# Patient Record
Sex: Female | Born: 1960 | Race: White | Hispanic: No | Marital: Married | Smoking: Former smoker
Health system: Southern US, Community
[De-identification: ages and names within clinical notes are randomized; demographics above are authoritative.]

## PROBLEM LIST (undated history)

## (undated) DIAGNOSIS — E785 Hyperlipidemia, unspecified: Secondary | ICD-10-CM

## (undated) DIAGNOSIS — K5790 Diverticulosis of intestine, part unspecified, without perforation or abscess without bleeding: Secondary | ICD-10-CM

## (undated) DIAGNOSIS — C4491 Basal cell carcinoma of skin, unspecified: Secondary | ICD-10-CM

## (undated) DIAGNOSIS — K635 Polyp of colon: Secondary | ICD-10-CM

## (undated) HISTORY — PX: WISDOM TOOTH EXTRACTION: SHX21

## (undated) HISTORY — PX: SQUAMOUS CELL CARCINOMA EXCISION: SHX2433

## (undated) HISTORY — DX: Diverticulosis of intestine, part unspecified, without perforation or abscess without bleeding: K57.90

## (undated) HISTORY — DX: Basal cell carcinoma of skin, unspecified: C44.91

## (undated) HISTORY — DX: Polyp of colon: K63.5

## (undated) HISTORY — DX: Hyperlipidemia, unspecified: E78.5

## (undated) HISTORY — PX: OTHER SURGICAL HISTORY: SHX169

---

## 2000-08-06 ENCOUNTER — Encounter: Admission: RE | Admit: 2000-08-06 | Discharge: 2000-08-06 | Payer: Self-pay | Admitting: Obstetrics and Gynecology

## 2000-08-06 ENCOUNTER — Encounter: Payer: Self-pay | Admitting: Obstetrics and Gynecology

## 2000-09-24 ENCOUNTER — Other Ambulatory Visit: Admission: RE | Admit: 2000-09-24 | Discharge: 2000-09-24 | Payer: Self-pay | Admitting: Obstetrics and Gynecology

## 2000-10-15 ENCOUNTER — Encounter (INDEPENDENT_AMBULATORY_CARE_PROVIDER_SITE_OTHER): Payer: Self-pay | Admitting: Specialist

## 2000-10-15 ENCOUNTER — Other Ambulatory Visit: Admission: RE | Admit: 2000-10-15 | Discharge: 2000-10-15 | Payer: Self-pay | Admitting: Obstetrics and Gynecology

## 2000-10-26 ENCOUNTER — Ambulatory Visit (HOSPITAL_COMMUNITY): Admission: RE | Admit: 2000-10-26 | Discharge: 2000-10-26 | Payer: Self-pay | Admitting: Obstetrics and Gynecology

## 2000-10-26 ENCOUNTER — Encounter (INDEPENDENT_AMBULATORY_CARE_PROVIDER_SITE_OTHER): Payer: Self-pay | Admitting: Specialist

## 2000-12-13 ENCOUNTER — Other Ambulatory Visit: Admission: RE | Admit: 2000-12-13 | Discharge: 2000-12-13 | Payer: Self-pay | Admitting: Obstetrics and Gynecology

## 2001-03-13 ENCOUNTER — Other Ambulatory Visit: Admission: RE | Admit: 2001-03-13 | Discharge: 2001-03-13 | Payer: Self-pay | Admitting: Obstetrics and Gynecology

## 2001-06-18 ENCOUNTER — Other Ambulatory Visit: Admission: RE | Admit: 2001-06-18 | Discharge: 2001-06-18 | Payer: Self-pay | Admitting: Obstetrics and Gynecology

## 2001-08-13 ENCOUNTER — Encounter: Admission: RE | Admit: 2001-08-13 | Discharge: 2001-08-13 | Payer: Self-pay | Admitting: Obstetrics and Gynecology

## 2001-08-13 ENCOUNTER — Encounter: Payer: Self-pay | Admitting: Obstetrics and Gynecology

## 2001-10-18 ENCOUNTER — Other Ambulatory Visit: Admission: RE | Admit: 2001-10-18 | Discharge: 2001-10-18 | Payer: Self-pay | Admitting: Obstetrics and Gynecology

## 2002-03-25 ENCOUNTER — Other Ambulatory Visit: Admission: RE | Admit: 2002-03-25 | Discharge: 2002-03-25 | Payer: Self-pay | Admitting: Obstetrics and Gynecology

## 2002-05-19 ENCOUNTER — Encounter: Payer: Self-pay | Admitting: Internal Medicine

## 2002-05-19 ENCOUNTER — Encounter: Admission: RE | Admit: 2002-05-19 | Discharge: 2002-05-19 | Payer: Self-pay | Admitting: Internal Medicine

## 2002-08-14 ENCOUNTER — Encounter: Admission: RE | Admit: 2002-08-14 | Discharge: 2002-08-14 | Payer: Self-pay | Admitting: Obstetrics and Gynecology

## 2002-08-14 ENCOUNTER — Encounter: Payer: Self-pay | Admitting: Obstetrics and Gynecology

## 2003-08-17 ENCOUNTER — Encounter: Admission: RE | Admit: 2003-08-17 | Discharge: 2003-08-17 | Payer: Self-pay | Admitting: Obstetrics and Gynecology

## 2003-08-17 ENCOUNTER — Encounter: Payer: Self-pay | Admitting: Obstetrics and Gynecology

## 2004-09-07 ENCOUNTER — Encounter: Admission: RE | Admit: 2004-09-07 | Discharge: 2004-09-07 | Payer: Self-pay | Admitting: Obstetrics and Gynecology

## 2005-05-16 ENCOUNTER — Ambulatory Visit: Payer: Self-pay | Admitting: Internal Medicine

## 2005-11-21 ENCOUNTER — Encounter: Admission: RE | Admit: 2005-11-21 | Discharge: 2005-11-21 | Payer: Self-pay | Admitting: Obstetrics and Gynecology

## 2005-11-22 ENCOUNTER — Encounter: Admission: RE | Admit: 2005-11-22 | Discharge: 2005-11-22 | Payer: Self-pay | Admitting: Obstetrics and Gynecology

## 2005-12-21 ENCOUNTER — Emergency Department (HOSPITAL_COMMUNITY): Admission: EM | Admit: 2005-12-21 | Discharge: 2005-12-21 | Payer: Self-pay | Admitting: Emergency Medicine

## 2006-10-08 ENCOUNTER — Ambulatory Visit: Payer: Self-pay | Admitting: Internal Medicine

## 2006-10-12 ENCOUNTER — Encounter: Admission: RE | Admit: 2006-10-12 | Discharge: 2006-10-12 | Payer: Self-pay | Admitting: Internal Medicine

## 2006-11-27 ENCOUNTER — Encounter: Admission: RE | Admit: 2006-11-27 | Discharge: 2006-11-27 | Payer: Self-pay | Admitting: Obstetrics and Gynecology

## 2006-12-05 ENCOUNTER — Encounter: Admission: RE | Admit: 2006-12-05 | Discharge: 2006-12-05 | Payer: Self-pay | Admitting: Obstetrics and Gynecology

## 2007-04-03 ENCOUNTER — Ambulatory Visit: Payer: Self-pay | Admitting: Internal Medicine

## 2007-09-09 ENCOUNTER — Telehealth (INDEPENDENT_AMBULATORY_CARE_PROVIDER_SITE_OTHER): Payer: Self-pay | Admitting: *Deleted

## 2007-12-17 ENCOUNTER — Encounter: Admission: RE | Admit: 2007-12-17 | Discharge: 2007-12-17 | Payer: Self-pay | Admitting: Obstetrics and Gynecology

## 2008-01-03 DIAGNOSIS — E785 Hyperlipidemia, unspecified: Secondary | ICD-10-CM

## 2008-06-16 ENCOUNTER — Ambulatory Visit: Payer: Self-pay | Admitting: Internal Medicine

## 2008-06-22 ENCOUNTER — Encounter (INDEPENDENT_AMBULATORY_CARE_PROVIDER_SITE_OTHER): Payer: Self-pay | Admitting: *Deleted

## 2008-08-25 ENCOUNTER — Telehealth (INDEPENDENT_AMBULATORY_CARE_PROVIDER_SITE_OTHER): Payer: Self-pay | Admitting: *Deleted

## 2008-12-28 ENCOUNTER — Ambulatory Visit: Payer: Self-pay | Admitting: Internal Medicine

## 2008-12-30 ENCOUNTER — Encounter: Admission: RE | Admit: 2008-12-30 | Discharge: 2008-12-30 | Payer: Self-pay | Admitting: Obstetrics and Gynecology

## 2008-12-31 ENCOUNTER — Encounter: Admission: RE | Admit: 2008-12-31 | Discharge: 2008-12-31 | Payer: Self-pay | Admitting: Obstetrics and Gynecology

## 2009-09-15 ENCOUNTER — Ambulatory Visit: Payer: Self-pay | Admitting: Internal Medicine

## 2009-09-15 DIAGNOSIS — N959 Unspecified menopausal and perimenopausal disorder: Secondary | ICD-10-CM | POA: Insufficient documentation

## 2009-09-15 DIAGNOSIS — C4491 Basal cell carcinoma of skin, unspecified: Secondary | ICD-10-CM | POA: Insufficient documentation

## 2009-09-16 ENCOUNTER — Encounter (INDEPENDENT_AMBULATORY_CARE_PROVIDER_SITE_OTHER): Payer: Self-pay | Admitting: *Deleted

## 2009-09-20 ENCOUNTER — Encounter (INDEPENDENT_AMBULATORY_CARE_PROVIDER_SITE_OTHER): Payer: Self-pay | Admitting: *Deleted

## 2009-09-20 ENCOUNTER — Telehealth (INDEPENDENT_AMBULATORY_CARE_PROVIDER_SITE_OTHER): Payer: Self-pay | Admitting: *Deleted

## 2009-09-20 ENCOUNTER — Encounter: Payer: Self-pay | Admitting: Internal Medicine

## 2009-09-20 ENCOUNTER — Ambulatory Visit: Payer: Self-pay | Admitting: Internal Medicine

## 2009-10-04 ENCOUNTER — Encounter (INDEPENDENT_AMBULATORY_CARE_PROVIDER_SITE_OTHER): Payer: Self-pay | Admitting: *Deleted

## 2009-10-27 ENCOUNTER — Encounter: Payer: Self-pay | Admitting: Internal Medicine

## 2009-10-27 LAB — HM COLONOSCOPY

## 2010-01-27 ENCOUNTER — Encounter: Admission: RE | Admit: 2010-01-27 | Discharge: 2010-01-27 | Payer: Self-pay | Admitting: Obstetrics and Gynecology

## 2010-02-08 ENCOUNTER — Ambulatory Visit: Payer: Self-pay | Admitting: Internal Medicine

## 2010-02-10 ENCOUNTER — Telehealth (INDEPENDENT_AMBULATORY_CARE_PROVIDER_SITE_OTHER): Payer: Self-pay | Admitting: *Deleted

## 2010-02-13 IMAGING — US UNKNOWN US STUDY
1 series · 4 of 4 positions shown · non-contrast
Comparison: [DATE] [DATE], [DATE], [DATE] [DATE], [DATE], [DATE] [DATE], [DATE],
[DATE] [DATE], [DATE]

CLINICAL DATA: Called back from screening mammogram for possible
mass left breast

DIGITAL DIAGNOSTIC  LEFT  MAMMOGRAM  December 31, 2008 AND LEFT
BREAST ULTRASOUND:

[Series 1: unknown us study · 4 of 4 slices shown]
[im 1/4]
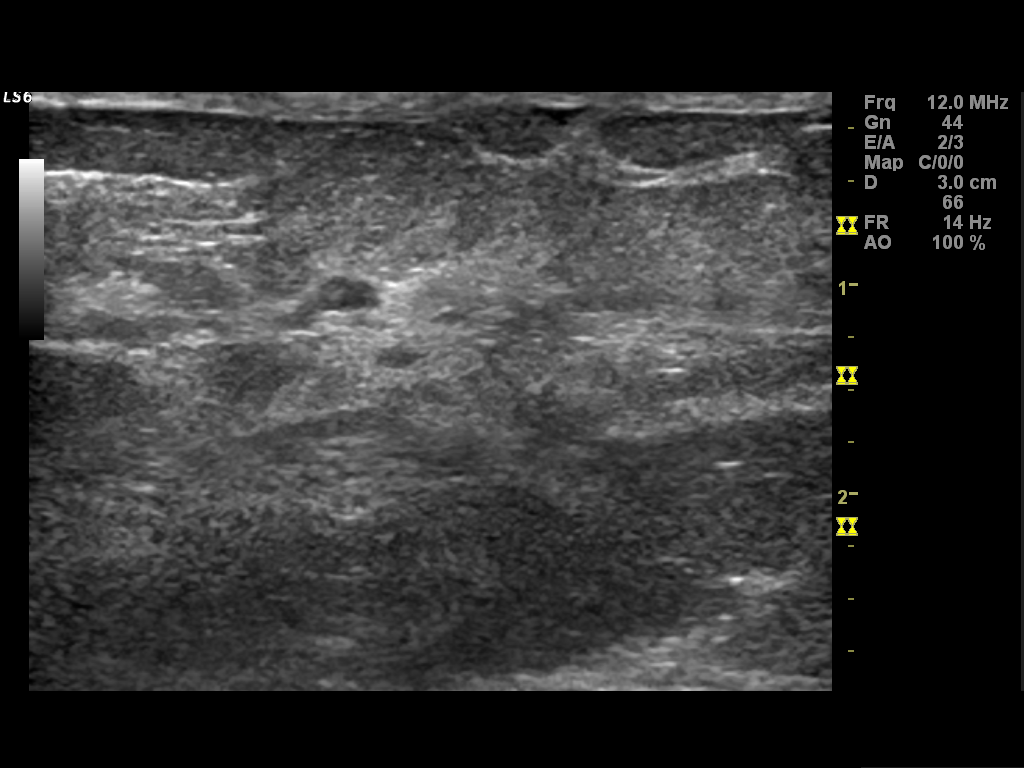
[im 2/4]
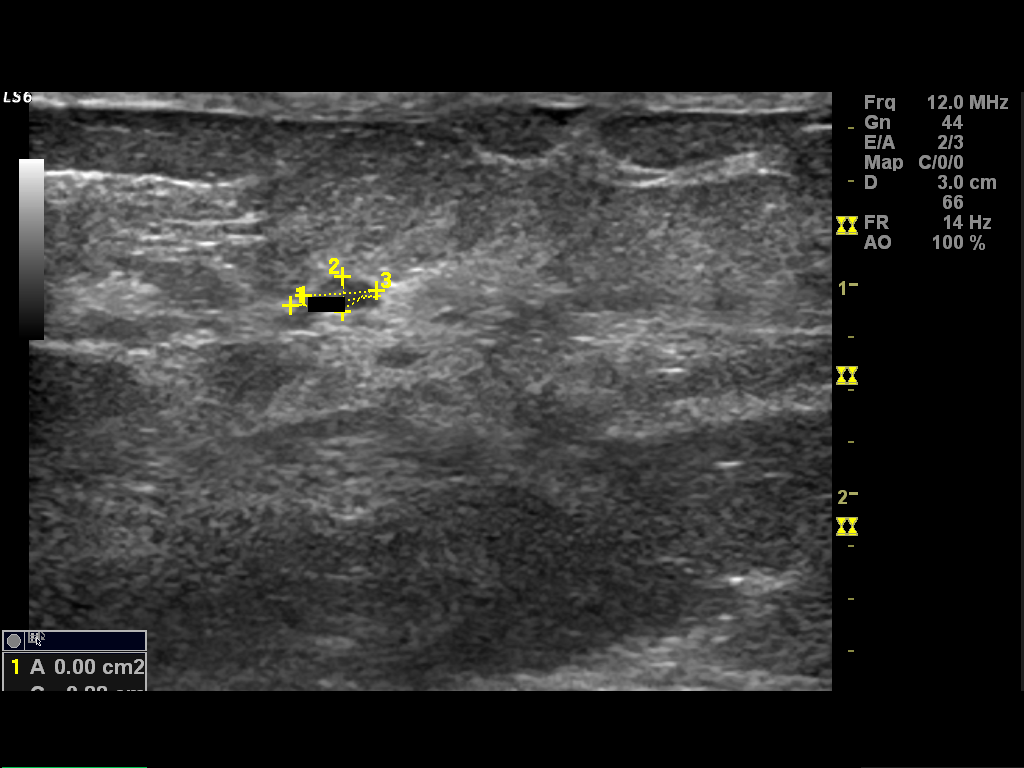
[im 3/4]
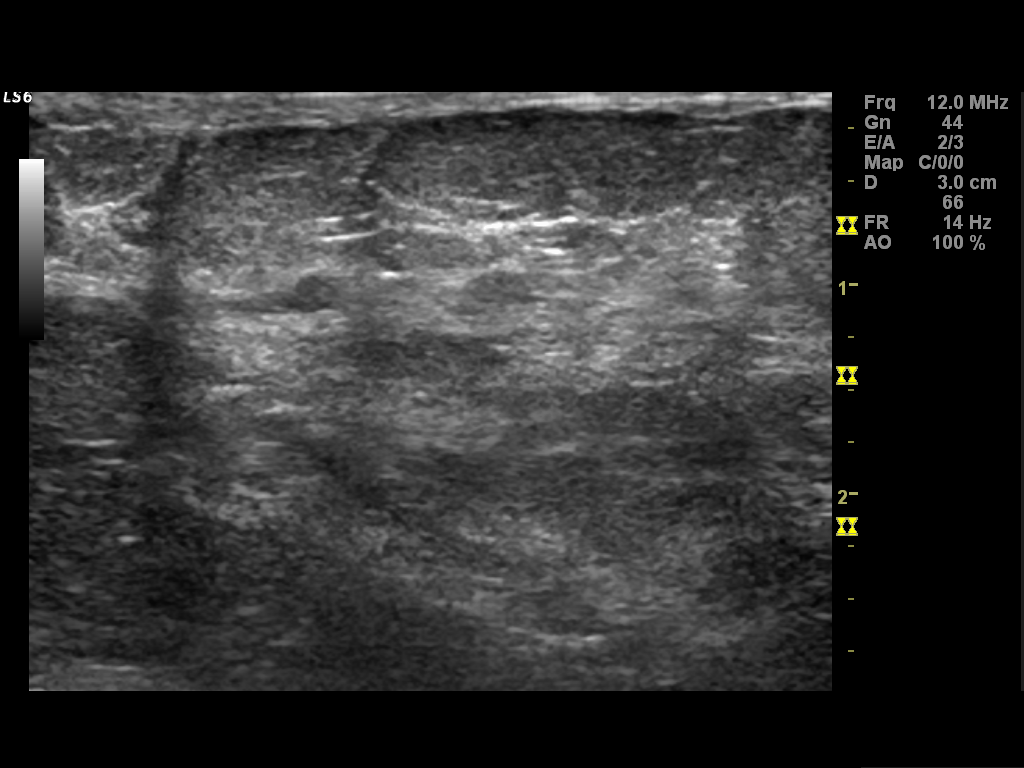
[im 4/4]
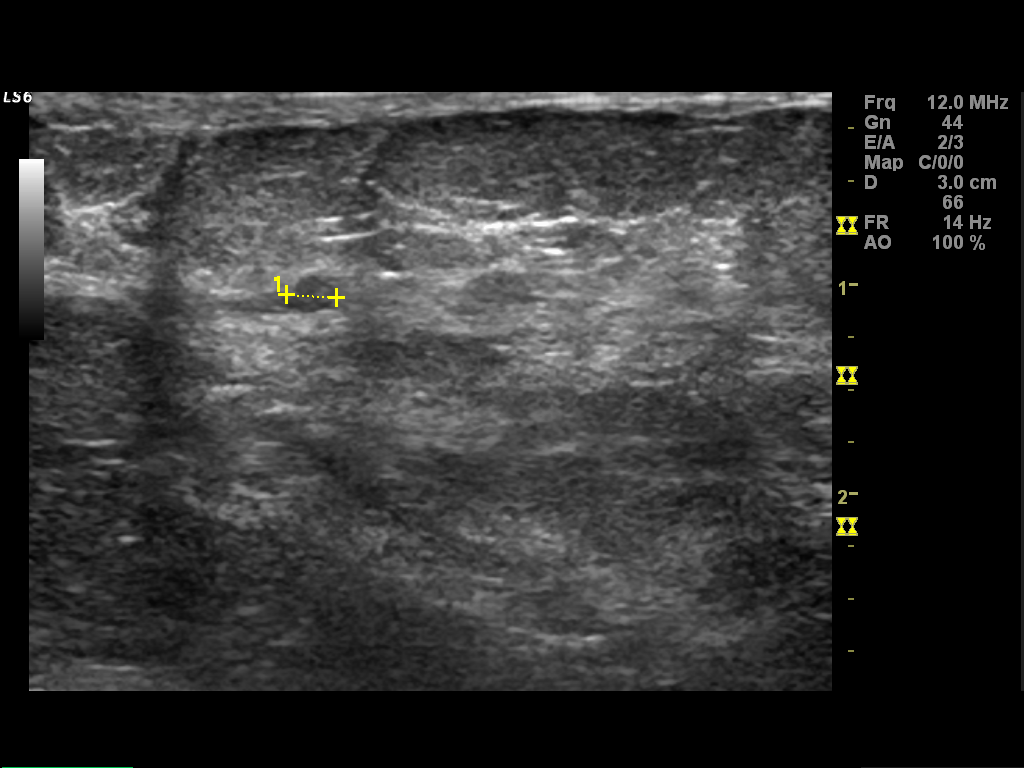

[4 of 4 positions shown; findings below may reference images not displayed]

FINDINGS: Spot compression CC and MLO views of the left breast are
submitted reviewed.  The previously noted small asymmetry in the
left breast three o'clock position persist measures approximate 5
mm in size.

Ultrasound is performed, showing a 0.35 x 0.17 x 0.24 cm oval
hypoechoic lesion with central increased echo texture at the left
breast three o'clock position 2 cm from the nipple; this correlates
to the mammographic finding and is consistent with an intramammary
lymph node.
IMPRESSION: Benign findings, recommend routine screening mammogram back on
schedule

BI-RADS CATEGORY 2:  Benign finding(s).

## 2010-09-19 ENCOUNTER — Ambulatory Visit: Payer: Self-pay | Admitting: Internal Medicine

## 2010-09-19 ENCOUNTER — Encounter: Payer: Self-pay | Admitting: Internal Medicine

## 2010-09-19 DIAGNOSIS — Z860100 Personal history of colon polyps, unspecified: Secondary | ICD-10-CM | POA: Insufficient documentation

## 2010-09-19 DIAGNOSIS — K5730 Diverticulosis of large intestine without perforation or abscess without bleeding: Secondary | ICD-10-CM | POA: Insufficient documentation

## 2010-09-20 LAB — CONVERTED CEMR LAB
BUN: 10 mg/dL (ref 6–23)
Basophils Absolute: 0 10*3/uL (ref 0.0–0.1)
Basophils Relative: 0.5 % (ref 0.0–3.0)
Bilirubin, Direct: 0.1 mg/dL (ref 0.0–0.3)
CO2: 29 meq/L (ref 19–32)
Calcium: 9 mg/dL (ref 8.4–10.5)
Chloride: 106 meq/L (ref 96–112)
Cholesterol: 238 mg/dL — ABNORMAL HIGH (ref 0–200)
Creatinine, Ser: 0.8 mg/dL (ref 0.4–1.2)
Direct LDL: 152.1 mg/dL
Eosinophils Absolute: 0 10*3/uL (ref 0.0–0.7)
Lymphocytes Relative: 46.1 % — ABNORMAL HIGH (ref 12.0–46.0)
MCHC: 34.8 g/dL (ref 30.0–36.0)
MCV: 92.7 fL (ref 78.0–100.0)
Monocytes Absolute: 0.4 10*3/uL (ref 0.1–1.0)
Neutrophils Relative %: 42.5 % — ABNORMAL LOW (ref 43.0–77.0)
Platelets: 235 10*3/uL (ref 150.0–400.0)
RDW: 13.2 % (ref 11.5–14.6)
Total Bilirubin: 0.5 mg/dL (ref 0.3–1.2)
Total Protein: 6.5 g/dL (ref 6.0–8.3)
Triglycerides: 47 mg/dL (ref 0.0–149.0)

## 2010-12-18 HISTORY — PX: OTHER SURGICAL HISTORY: SHX169

## 2011-01-07 ENCOUNTER — Other Ambulatory Visit: Payer: Self-pay | Admitting: Obstetrics and Gynecology

## 2011-01-07 ENCOUNTER — Encounter: Payer: Self-pay | Admitting: Obstetrics and Gynecology

## 2011-01-07 DIAGNOSIS — Z1239 Encounter for other screening for malignant neoplasm of breast: Secondary | ICD-10-CM

## 2011-01-15 LAB — CONVERTED CEMR LAB
ALT: 17 units/L (ref 0–35)
AST: 25 units/L (ref 0–37)
Alkaline Phosphatase: 44 units/L (ref 39–117)
Alkaline Phosphatase: 60 units/L (ref 39–117)
BUN: 10 mg/dL (ref 6–23)
BUN: 7 mg/dL (ref 6–23)
Basophils Absolute: 0 10*3/uL (ref 0.0–0.1)
Basophils Relative: 0.1 % (ref 0.0–3.0)
Basophils Relative: 1.6 % — ABNORMAL HIGH (ref 0.0–1.0)
Bilirubin, Direct: 0.1 mg/dL (ref 0.0–0.3)
Bilirubin, Direct: 0.1 mg/dL (ref 0.0–0.3)
Calcium: 9.3 mg/dL (ref 8.4–10.5)
Calcium: 9.3 mg/dL (ref 8.4–10.5)
Calcium: 9.7 mg/dL (ref 8.4–10.5)
Chloride: 103 meq/L (ref 96–112)
Cholesterol, target level: 200 mg/dL
Cholesterol: 240 mg/dL (ref 0–200)
Cholesterol: 251 mg/dL — ABNORMAL HIGH (ref 0–200)
Creatinine, Ser: 0.8 mg/dL (ref 0.4–1.2)
Creatinine, Ser: 0.9 mg/dL (ref 0.4–1.2)
Direct LDL: 164.4 mg/dL
Eosinophil percent: 1.2 % (ref 0.0–5.0)
Eosinophils Absolute: 0 10*3/uL (ref 0.0–0.7)
Eosinophils Relative: 0.9 % (ref 0.0–5.0)
FSH: 132.2 milliintl units/mL
GFR calc Af Amer: 99 mL/min
GFR calc non Af Amer: 82 mL/min
Glucose, Bld: 95 mg/dL (ref 70–99)
HCT: 39 % (ref 36.0–46.0)
HCT: 42.8 % (ref 36.0–46.0)
HDL goal, serum: 40 mg/dL
HDL: 80.1 mg/dL (ref >39.00)
Hemoglobin: 14.5 g/dL (ref 12.0–15.0)
Hgb A1c MFr Bld: 5.4 % (ref 4.6–6.5)
Lymphocytes Relative: 35.1 % (ref 12.0–46.0)
MCHC: 33.8 g/dL (ref 30.0–36.0)
MCHC: 35.1 g/dL (ref 30.0–36.0)
MCV: 91.9 fL (ref 78.0–100.0)
MCV: 92.4 fL (ref 78.0–100.0)
Monocytes Absolute: 0.2 10*3/uL (ref 0.1–1.0)
Monocytes Absolute: 0.3 10*3/uL (ref 0.1–1.0)
Monocytes Absolute: 0.3 10*3/uL (ref 0.2–0.7)
Monocytes Relative: 6.8 % (ref 3.0–12.0)
Neutro Abs: 1.6 10*3/uL (ref 1.4–7.7)
Neutrophils Relative %: 42.7 % — ABNORMAL LOW (ref 43.0–77.0)
Neutrophils Relative %: 57.2 % (ref 43.0–77.0)
Platelets: 236 10*3/uL (ref 150–400)
Platelets: 246 10*3/uL (ref 150.0–400.0)
Potassium: 4.4 meq/L (ref 3.5–5.1)
RBC: 4.44 M/uL (ref 3.87–5.11)
RDW: 11.9 % (ref 11.5–14.6)
Sodium: 143 meq/L (ref 135–145)
TSH: 1.91 microintl units/mL (ref 0.35–5.50)
Total Bilirubin: 0.8 mg/dL (ref 0.3–1.2)
Total Bilirubin: 0.8 mg/dL (ref 0.3–1.2)
Total CHOL/HDL Ratio: 2.8
Total CHOL/HDL Ratio: 3
Total Protein: 7.2 g/dL (ref 6.0–8.3)
Triglycerides: 40 mg/dL (ref 0–149)
Triglycerides: 70 mg/dL (ref 0.0–149.0)
VLDL: 14 mg/dL (ref 0.0–40.0)
VLDL: 8 mg/dL (ref 0–40)
WBC: 4.7 10*3/uL (ref 4.5–10.5)

## 2011-01-19 NOTE — Assessment & Plan Note (Signed)
Summary: cpx - lab/cbs   Vital Signs:  Patient profile:   50 year old female Height:      65.75 inches Weight:      152.6 pounds BMI:     24.91 Temp:     98.3 degrees F oral Pulse rate:   72 / minute Resp:     14 per minute BP sitting:   104 / 70  (left arm) Cuff size:   large  Vitals Entered By: Shonna Chock CMA (September 19, 2010 8:45 AM)    History of Present Illness: Alicia Hensley is here for a physical ; she is asymptomatic.  Current Medications (verified): 1)  Mvi-Women's .Marland Kitchen.. 1 By Mouth Once Daily 2)  Vit-E .Marland Kitchen.. 1 By Mouth Once Daily 3)  Vit-C .Marland Kitchen.. 1 By Mouth Once Daily 4)  Calcium .Marland Kitchen.. 1 By Mouth Once Daily 5)  Folic Acid .Marland Kitchen.. 1 By Mouth Qd 6)  Vit-D 1000 Iu .Marland Kitchen.. 1 By Mouth Once Daily 7)  Fish Oil .Marland Kitchen.. 1 By Mouth Once Daily 8)  Augmentin Xr 1000-62.5 Mg Xr12h-Tab (Amoxicillin-Pot Clavulanate) .... 2 By Mouth Two Times A Day  Allergies (verified): No Known Drug Allergies  Past History:  Past Medical History: Hyperlipidemia:  NMR Lipoprofile Panel  2007( LDL 144 with 948 total particles &  no  small dense particles). Ideal LDL < 150. Skin cancer, PMH  of, Basal  Cell RUE & back, Dr  Campbell Stall Colonic polyps, PMH  of 2010 , Dr Loreta Ave Diverticulosis, colon  Past Surgical History: THROAT CYST resected 1991 ; G2 P 2; Septal deviation repair Colonoscopy negative 2004, Dr Sherin Quarry; 2010: Tics, polyps  Dr Loreta Ave ( repeat 2015) ; ER visit for chest pain, negative  cardiac evaluation 2007  Family History:  MGM :HTN, ?pre cancerous  polyps; sister: benign thyroid nodule; F: d MVA @ 52; M: colon polyps; 3 MG aunts : colon cancer  Social History: no diet Occupation:Business Owner (retail) Former Smoker; quit 2001 Alcohol use-yes: socially Regular exercise-no  Review of Systems  The patient denies anorexia, fever, weight loss, weight gain, vision loss, decreased hearing, hoarseness, chest pain, syncope, dyspnea on exertion, peripheral edema, prolonged cough, headaches,  abdominal pain, melena, hematochezia, severe indigestion/heartburn, hematuria, suspicious skin lesions, depression, unusual weight change, abnormal bleeding, enlarged lymph nodes, and angioedema.    Physical Exam  General:  well-nourished;alert,appropriate and cooperative throughout examination Head:  Normocephalic and atraumatic without obvious abnormalities.  Eyes:  No corneal or conjunctival inflammation noted.Marland Kitchen Perrla. Funduscopic exam benign, without hemorrhages, exudates or papilledema. Ears:  External ear exam shows no significant lesions or deformities.  Otoscopic examination reveals clear canals, tympanic membranes are intact bilaterally without bulging, retraction, inflammation or discharge. Hearing is grossly normal bilaterally. Nose:  External nasal examination shows no deformity or inflammation. Nasal mucosa are pink and moist without lesions or exudates. Slight septal deviation Mouth:  Oral mucosa and oropharynx without lesions or exudates.  Teeth in excellent  repair. Neck:  No deformities, masses, or tenderness noted. Lungs:  Normal respiratory effort, chest expands symmetrically. Lungs are clear to auscultation, no crackles or wheezes. Heart:  Normal rate and regular rhythm. S1 and S2 normal without gallop, murmur, click, rub . Soft S4 Abdomen:  Bowel sounds positive,abdomen soft and non-tender without masses, organomegaly or hernias noted. Genitalia:  Dr  Ambrose Mantle Msk:  No deformity or scoliosis noted of thoracic or lumbar spine.   Pulses:  R and L carotid,radial,dorsalis pedis and posterior tibial pulses are full and equal bilaterally  Extremities:  No clubbing, cyanosis, edema, or deformity noted with normal full range of motion of all joints.   Neurologic:  alert & oriented X3 and DTRs symmetrical and normal.   Skin:  Intact without suspicious lesions or rashes Cervical Nodes:  No lymphadenopathy noted Axillary Nodes:  No palpable lymphadenopathy Psych:  memory intact for  recent and remote, normally interactive, and good eye contact.     Impression & Recommendations:  Problem # 1:  ROUTINE GENERAL MEDICAL EXAM@HEALTH  CARE FACL (ICD-V70.0)  Orders: EKG w/ Interpretation (93000) Venipuncture (47829) TLB-Lipid Panel (80061-LIPID) TLB-BMP (Basic Metabolic Panel-BMET) (80048-METABOL) TLB-CBC Platelet - w/Differential (85025-CBCD) TLB-Hepatic/Liver Function Pnl (80076-HEPATIC) TLB-TSH (Thyroid Stimulating Hormone) (84443-TSH)  Problem # 2:  COLONIC POLYPS, HX OF (ICD-V12.72) as per Dr Loreta Ave  Problem # 3:  SKIN CANCER, HX OF (ICD-V10.83) as per Dr Danella Deis  Problem # 4:  HYPERLIPIDEMIA (ICD-272.4) excellent NMR Lipoprofile values; LDL goal = < 150 ideally  Complete Medication List: 1)  Mvi-women's  .Marland KitchenMarland Kitchen. 1 by mouth once daily 2)  Vit-e  .Marland KitchenMarland Kitchen. 1 by mouth once daily 3)  Vit-c  .Marland Kitchen.. 1 by mouth once daily 4)  Calcium  .Marland KitchenMarland Kitchen. 1 by mouth once daily 5)  Folic Acid  .Marland Kitchen.. 1 by mouth qd 6)  Vit-d 1000 Iu  .Marland Kitchen.. 1 by mouth once daily 7)  Fish Oil  .Marland Kitchen.. 1 by mouth once daily 8)  Augmentin Xr 1000-62.5 Mg Xr12h-tab (Amoxicillin-pot clavulanate) .... 2 by mouth two times a day  Other Orders: Admin 1st Vaccine (56213) Flu Vaccine 7yrs + (08657) Flu Vaccine Consent Questions     Do you have a history of severe allergic reactions to this vaccine? no    Any prior history of allergic reactions to egg and/or gelatin? no    Do you have a sensitivity to the preservative Thimersol? no    Do you have a past history of Guillan-Barre Syndrome? no    Do you currently have an acute febrile illness? no    Have you ever had a severe reaction to latex? no    Vaccine information given and explained to patient? yes    Are you currently pregnant? no    Lot Number:[id number]   Exp Date:06/17/2011   Site Given Right Deltoid IM1st Vaccine (84696) Flu Vaccine 40yrs + (29528)  Patient Instructions: 1)  It is important that you exercise regularly at least 20 minutes 5 times a week. If  you develop chest pain, have severe difficulty breathing, or feel very tired , stop exercising immediately and seek medical attention. 2)  Take an  81 mg coated Aspirin every day.   .lbflu    Appended Document: cpx - lab/cbs

## 2011-01-19 NOTE — Assessment & Plan Note (Signed)
Summary: pain in elbow/kdc   Vital Signs:  Patient profile:   50 year old female Height:      66 inches Weight:      149 pounds BMI:     24.14 Pulse rate:   78 / minute BP sitting:   110 / 60  Vitals Entered By: rachel peeler CC: rt. elbow pain Comments rt. elbow-painful to touch, painful to bend, swollen red x 2days pt. does not remember hitting elbow, pt. thinks may be fluid   History of Present Illness: pain at the R elbow x 2 days   Allergies: No Known Drug Allergies  Past History:  Past Medical History: Reviewed history from 09/15/2009 and no changes required. Hyperlipidemia: IDEAL LDL  < 150 based on NMR Lipoprofile Panel in 2007( LDL 144 with 948 total & 0 small dense particles) Skin cancer, hx of, Basal  Cell RUE & back, Dr Danella Deis  Past Surgical History: Reviewed history from 09/15/2009 and no changes required. THROAT CYST resected ; G2 P 2; Septal deviation repair Colonoscopy negative 2004, Dr Sherin Quarry ; ER visit for chest pain, neg cardiac evaluation 2007  Social History: Reviewed history from 09/15/2009 and no changes required. 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   Msk:  L elbom normal R elbow: slightly swollen w/o fluctuancy, slightly  red distally from the olecranon  no tender at the epicondiles   Impression & Recommendations:  Problem # 1:  ELBOW PAIN (ICD-719.42) unclear if she has tendinitis or she is developing bursitis Will start on anti-inflammatories but also antibiotics ( septic bursitis?) if she is no better, or if she gets worse, she will need to be seen by ortho  Complete Medication List: 1)  Mvi-women's  .Marland KitchenMarland Kitchen. 1 by mouth once daily 2)  Vit-e  .Marland KitchenMarland Kitchen. 1 by mouth once daily 3)  Vit-c  .Marland Kitchen.. 1 by mouth once daily 4)  Calcium  .Marland KitchenMarland Kitchen. 1  by mouth once daily 5)  Folic Acid  .Marland Kitchen.. 1 by mouth qd 6)  Vit-d 1000 Iu  .Marland Kitchen.. 1 by mouth once daily 7)  Fish Oil  .Marland Kitchen.. 1 by mouth once daily 8)  Grape Seed Extract  .Marland KitchenMarland Kitchen. 1 by mouth once daily 9)  Mega Enzyme  .Marland KitchenMarland Kitchen. 1 by mouth once daily 10)  Augmentin Xr 1000-62.5 Mg Xr12h-tab (Amoxicillin-pot clavulanate) .... 2 by mouth two times a day  Patient Instructions: 1)  take 400  mg of Ibuprofen (Advil, Motrin) with food every  6 hours as needed  for relief of pain . Stop ibuprofen if it irritates your stomach 2)  ice two times a day  3)  augmentin 4)  call any time if symptoms worsen 5)  call if no better within 36 hours  Prescriptions: AUGMENTIN XR 1000-62.5 MG XR12H-TAB (AMOXICILLIN-POT CLAVULANATE) 2 by mouth two times a day  #28 x 0   Entered and Authorized by:   Nolon Rod. Camrin Gearheart MD   Signed by:   Nolon Rod. Brantly Kalman MD on 02/08/2010   Method used:   Electronically to        [facility]* (retail)       [address]       [place], [place]  [address]  Ph: [phone]       Fax: [phone]   RxID:   [id number]

## 2011-01-19 NOTE — Progress Notes (Signed)
Summary: check on pt  ---- Converted from flag ---- ---- 02/08/2010 6:03 PM, Jose E. Paz MD wrote: please check on her ------------------------------  Phone Note Outgoing Call   Summary of Call: left message on machine to contact office to let us know how she is doing....Marland KitchenMarland KitchenDoristine Devoid  February 10, 2010 12:16 PM    spoke w/ patient says her elbow is doing much better........Marland KitchenDoristine Devoid  February 10, 2010 2:18 PM

## 2011-01-30 ENCOUNTER — Ambulatory Visit: Payer: Self-pay

## 2011-02-08 ENCOUNTER — Ambulatory Visit
Admission: RE | Admit: 2011-02-08 | Discharge: 2011-02-08 | Disposition: A | Discharge status: Home / Self Care | Payer: 59 | Source: Ambulatory Visit | Attending: Obstetrics and Gynecology | Admitting: Obstetrics and Gynecology

## 2011-02-08 DIAGNOSIS — Z1239 Encounter for other screening for malignant neoplasm of breast: Secondary | ICD-10-CM

## 2011-02-28 ENCOUNTER — Other Ambulatory Visit: Payer: Self-pay | Admitting: Internal Medicine

## 2011-02-28 ENCOUNTER — Encounter: Payer: Self-pay | Admitting: Internal Medicine

## 2011-02-28 ENCOUNTER — Ambulatory Visit (INDEPENDENT_AMBULATORY_CARE_PROVIDER_SITE_OTHER): Payer: 59 | Admitting: Internal Medicine

## 2011-02-28 DIAGNOSIS — R519 Headache, unspecified: Secondary | ICD-10-CM | POA: Insufficient documentation

## 2011-02-28 DIAGNOSIS — R51 Headache: Secondary | ICD-10-CM

## 2011-02-28 DIAGNOSIS — R079 Chest pain, unspecified: Secondary | ICD-10-CM | POA: Insufficient documentation

## 2011-03-01 LAB — CBC WITH DIFFERENTIAL/PLATELET
Basophils Relative: 0.1 % (ref 0.0–3.0)
Eosinophils Relative: 2.5 % (ref 0.0–5.0)
HCT: 39.7 % (ref 36.0–46.0)
Lymphs Abs: 1.7 10*3/uL (ref 0.7–4.0)
Monocytes Relative: 6.1 % (ref 3.0–12.0)
Platelets: 256 10*3/uL (ref 150.0–400.0)
RBC: 4.3 Mil/uL (ref 3.87–5.11)
WBC: 4 10*3/uL — ABNORMAL LOW (ref 4.5–10.5)

## 2011-03-01 LAB — CARDIAC PANEL
CK-MB: 0.8 ng/mL (ref 0.3–4.0)
Total CK: 54 U/L (ref 7–177)

## 2011-03-01 LAB — SEDIMENTATION RATE: Sed Rate: 10 mm/hr (ref 0–22)

## 2011-03-07 NOTE — Assessment & Plan Note (Signed)
Summary: headache/cbs   Vital Signs:  Patient profile:   50 year old female Weight:      148.8 pounds BMI:     24.29 Temp:     98.8 degrees F oral Pulse rate:   84 / minute Resp:     14 per minute BP sitting:   104 / 70  (left arm) Cuff size:   large  Vitals Entered By: Shonna Chock CMA (February 28, 2011 3:25 PM) CC: 1.) HA's  2.) Chest pain , Headaches   CC:  1.) HA's  2.) Chest pain  and Headaches.  History of Present Illness:    Onset 02/22/2011  as throbbing headache in both temples; viral infection diagnosed @ UC in [place]. Rx: NSAIDS, Tylenol. She now  reports photophobia, but denies nausea, vomiting, sweats, tearing of eyes, nasal congestion, sinus pain, sinus pressure,purulence  and phonophobia with headaches.  The location of the pain is bitemporal,@ nasal bridge & occipitally.  High-risk features (red flags) include neck pain/stiffness.  The patient denies the following high-risk features: fever, vision loss or change, focal weakness, rash, and pain worse with exertion.  The headaches has no trigger. She has taken NSAIDS daily ,up to 8/ pill. She took 4 yesterday.      The patient also complains of Chest  discomfort since 03/07 .  The patient reports exertional chest pain and light headedness, but denies resting chest pain, nausea, vomiting, diaphoresis, shortness of breath, palpitations, and indigestion.  The pain is located in the epigastric area and the pain does not radiate.  Episodes of chest pain last < 1 minute.  The pain is brought on or made worse only by walking; not by  coughing, deep breathing, and upper body movement.  The pain is relieved or improved with rest.  The onset of the epigastric discomfort was prior to  starting  NSAIDS  Current Medications (verified): 1)  Mvi-Women's .Marland Kitchen.. 1 By Mouth Once Daily 2)  Vit-E .Marland Kitchen.. 1 By Mouth Once Daily 3)  Vit-C .Marland Kitchen.. 1 By Mouth Once Daily 4)  Calcium .Marland Kitchen.. 1 By Mouth Once Daily 5)  Folic Acid .Marland Kitchen.. 1 By Mouth Qd 6)  Vit-D 1000 Iu  .Marland Kitchen.. 1 By Mouth Once Daily 7)  Fish Oil .Marland Kitchen.. 1 By Mouth Once Daily  Allergies (verified): No Known Drug Allergies  Review of Systems ENT:  Denies decreased hearing, difficulty swallowing, earache, hoarseness, and ringing in ears. GI:  Denies bloody stools and dark tarry stools.  Physical Exam  General:  Appears uncomfortable,in no acute distress; alert,appropriate and cooperative throughout examination Head:  Normocephalic and atraumatic without obvious abnormalities. Temples not tender Eyes:  No corneal or conjunctival inflammation noted. EOMI. Perrla. Field of  Vision grossly normal. Ears:  External ear exam shows no significant lesions or deformities.  Otoscopic examination reveals clear canals, tympanic membranes are intact bilaterally without bulging, retraction, inflammation or discharge. Hearing is grossly normal bilaterally. Nose:  External nasal examination shows no deformity or inflammation. Nasal mucosa are pink and moist without lesions or exudates. Mouth:  Oral mucosa and oropharynx without lesions or exudates.  Teeth in good repair. Neck:  Supple ; full ROM; no meningismus Chest Wall:  no tenderness.   Lungs:  Normal respiratory effort, chest expands symmetrically. Lungs are clear to auscultation, no crackles or wheezes. Heart:  Normal rate and regular rhythm. S1 and S2 normal without gallop, murmur, click, rub .S4 Abdomen:  Bowel sounds positive,abdomen soft and non-tender without masses, organomegaly or hernias noted.  No AAA  Pulses:  R and L carotid,radial,dorsalis pedis and posterior tibial pulses are full and equal bilaterally Extremities:  No clubbing, cyanosis, edema, or deformity noted .Marland Kitchen   Neurologic:  alert & oriented X3, cranial nerves II-XII intact, strength normal in all extremities, sensation intact to light touch, gait normal, DTRs symmetrical and normal, finger-to-nose normal, heel-to-shin normal, and Romberg negative.   Skin:  Intact without suspicious lesions  or rashes Cervical Nodes:  No lymphadenopathy noted Axillary Nodes:  No palpable lymphadenopathy Psych:  memory intact for recent and remote, normally interactive, and good eye contact.     Impression & Recommendations:  Problem # 1:  HEADACHE (ICD-784.0)  Orders: TLB-Sedimentation Rate (ESR) (85652-ESR) Specimen Handling (16109)  Her updated medication list for this problem includes:    Tramadol Hcl 50 Mg Tabs (Tramadol hcl) .Marland Kitchen... 1 every 6 hrs as needed for headaches  Problem # 2:  CHEST PAIN (ICD-786.50) atypical; epigastric Orders: EKG w/ Interpretation (93000) Venipuncture (60454) TLB-CBC Platelet - w/Differential (85025-CBCD) TLB-Cardiac Panel (09811_91478-GNFA) T-D-Dimer Fibrin Derivatives Quantitive (21308-65784) Specimen Handling (69629)  Complete Medication List: 1)  Mvi-women's  .Marland KitchenMarland Kitchen. 1 by mouth once daily 2)  Vit-e  .Marland KitchenMarland Kitchen. 1 by mouth once daily 3)  Vit-c  .Marland Kitchen.. 1 by mouth once daily 4)  Calcium  .Marland KitchenMarland Kitchen. 1 by mouth once daily 5)  Folic Acid  .Marland Kitchen.. 1 by mouth qd 6)  Vit-d 1000 Iu  .Marland Kitchen.. 1 by mouth once daily 7)  Fish Oil  .Marland Kitchen.. 1 by mouth once daily 8)  Tramadol Hcl 50 Mg Tabs (Tramadol hcl) .Marland Kitchen.. 1 every 6 hrs as needed for headaches 9)  Fluticasone Propionate 50 Mcg/act Susp (Fluticasone propionate) .Marland Kitchen.. 1 spray two times a day as needed  Patient Instructions: 1)  Keep Headache Diary as discussed. Prescriptions: FLUTICASONE PROPIONATE 50 MCG/ACT SUSP (FLUTICASONE PROPIONATE) 1 spray two times a day as needed  #1 x 5   Entered and Authorized by:   Marga Melnick MD   Signed by:   Marga Melnick MD on 02/28/2011   Method used:   Electronically to        [id number] (retail)       [address] [place], [place]  [address]       Ph: [phone]       Fax: [phone]   RxID:   [id number] TRAMADOL HCL 50 MG TABS (TRAMADOL HCL) 1 every 6 hrs as needed for headaches  #30 x 0   Entered and Authorized by:   Marga Melnick MD   Signed by:    Marga Melnick MD on 02/28/2011   Method used:   Electronically to        [id number]* (retail)       [address] [place], [place]  [address]       Ph: [phone]       Fax: [phone]   RxID:   [id number]    Orders Added: 1)  EKG w/ Interpretation [93000] 2)  Est. Patient Level IV [37106] 3)  Venipuncture [36415] 4)  TLB-CBC Platelet - w/Differential [85025-CBCD] 5)  TLB-Cardiac Panel [82550_82553-CARD] 6)  TLB-Sedimentation Rate (ESR) [85652-ESR] 7)  T-D-Dimer Fibrin Derivatives Quantitive [26948-54627] 8)  Specimen Handling [99000]

## 2011-05-05 NOTE — Op Note (Signed)
[facility]  Patient:    Alicia Hensley, Alicia Hensley                        MRN: [id number] Proc. Date: 10/26/00 Adm. 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  OPERATION:  D&C, laser cone.  SURGEON:  Malachi Pro. Ambrose Mantle, M.D.  ANESTHESIA:  General.  DESCRIPTION OF PROCEDURE:  The patient was brought to the operating room and placed under satisfactory general anesthesia and placed in the lithotomy position.  Exam revealed the uterus to be mid plane, normal size.  The adnexa were free of masses.  The cervix was exposed and painted with a 5% solution of acetic acid.  Squamocolumnar junction was easily seen.  There was slight white epithelium anteriorly and posteriorly, but the changes were relatively minimal, not enough to suggest severe dysplasia.  The changes actually extended fairly far out on the right part of the cervix.  I outlined the cone with a 10 watt laser continuous.  Then, I injected the cone site with a dilute solution of Pitressin using about 15 cc of a solution with 10 units of Pitressin and 120 cc of saline.  I then used the ________ to do the cone.  I cut the cone off with the scissors at the endocervical margin.  I then established hemostasis with a defocused beam at about 10 watts.  I did a fractional D&C after gently dilating the cervix minimally, preserved the endocervical and endometrial tissues, reapproximated the cervix with four sutures of 0 chromic at 12, 6, 3, and 9 oclock.  Placed Monsel solution in the cervical cone bed and the procedure was terminated.  Blood loss was less than 10 cc.  The patient was returned to recovery in satisfactory condition. DD:   10/26/00 TD:  10/26/00 Job: [id number]

## 2011-05-05 NOTE — Assessment & Plan Note (Signed)
[facility]                        [facility] OFFICE NOTE   NAME:[name], [name]                        MRN:          [id number]  DATE:04/03/2007                            DOB:          03-10-1961    Alicia Hensley was seen April 03, 2007 for epigastric pain.  It is  described as a hunger pain and seemingly related to stress.  She will  work out cardiovascularly, running and also uses Sport and exercise psychologist.  She does not believe these definitely aggravate the symptoms.   She was seen in the emergency room in 2007 for chest pain, which was  attributed to stress.  She says these symptoms were not like that.   She denies any dysphagia or definite melena.  She states the stools have  been slightly darker.   She has been treating the symptoms with the supplements with suboptimal  response.   She quit smoking in December of 2006.  She drinks occasionally.  She has  occasional tea & 2 cups of coffee a day, but denies intake of any cola  or chocolate.   She had a colonoscopy in 2004 by Dr. Sherin Quarry, which was negative.  She  contacted his office concerning these symptoms  and they referred her  here.   She describes increased stress at the office related to employee issues.   She is 5 feet 6 inches and weighs 137.4 fully clothed, pulse is 68,  respiratory rate is 14, and blood pressure 130/80.  Her weight is down  approximately a pound-and-a-half.  She has no icterus or jaundice.  CHEST:  Clear.  She has an S4 with slurring, but no significant murmurs.  She has no tenderness over the sternum or inferior costal margins.  ABDOMEN:  Soft with no organomegaly.  The aorta is easily palpable and  is not enlarged.  She has no epigastric or right upper quadrant  tenderness.  She tears at time as she discusses the stresses she is experiencing.   The most likely etiology here would be significant reflux.  I will  recommend that she avoid triggers, such as  the aspirin family, alcohol,  peppermints, caffeine, coffee, tea, cola, and chocolate.  She should not  eat within 2 hours of going to bed.  I will put her on a proton pump  inhibitor once daily to see if it will control the symptoms.  I have  also given her stool cards to collect.   If symptoms persist, then referral to Dr. Sherin Quarry would be pursued.    Titus Dubin. Alwyn Ren, MD,FACP,FCCP  Electronically Signed   [facility]  DD: 04/03/2007  DT: 04/03/2007  Job #: [id number]

## 2011-07-26 ENCOUNTER — Other Ambulatory Visit: Payer: Self-pay | Admitting: Internal Medicine

## 2011-07-26 NOTE — Telephone Encounter (Signed)
Spoke with patient and she said this was sent to [place] in error, patient had her dentist fill this medication for her

## 2011-10-13 ENCOUNTER — Encounter: Payer: Self-pay | Admitting: Internal Medicine

## 2011-10-13 ENCOUNTER — Ambulatory Visit (INDEPENDENT_AMBULATORY_CARE_PROVIDER_SITE_OTHER): Payer: 59 | Admitting: Internal Medicine

## 2011-10-13 VITALS — BP 116/74 | HR 82 | Temp 98.2°F | Wt 147.2 lb

## 2011-10-13 DIAGNOSIS — J209 Acute bronchitis, unspecified: Secondary | ICD-10-CM

## 2011-10-13 DIAGNOSIS — J0100 Acute maxillary sinusitis, unspecified: Secondary | ICD-10-CM

## 2011-10-13 MED ORDER — AMOXICILLIN 500 MG PO CAPS: 500.0000 mg | ORAL_CAPSULE | Freq: Three times a day (TID) | ORAL | Status: AC

## 2011-10-13 NOTE — Progress Notes (Signed)
  Subjective:    Patient ID: Alicia Hensley, female    DOB: [birth year], 50 y.o.   MRN: [id number]  HPI Respiratory tract infection Onset/symptoms:10/22 as head congestion & dry cough Exposures (illness/environmental/extrinsic):daughter ill Progression of symptoms:stable Treatments/response:natural supplements Present symptoms: Fever/chills/sweats:no Frontal headache:no Facial pain:no Nasal purulence:yellow Sore throat:yes Dental pain:L upper molar with cough or "sniffing" Lymphadenopathy:no Wheezing/shortness of breath:no Cough/sputum/hemoptysis:yellow sputum , > from head Associated extrinsic/allergic symptoms:itchy eyes/ sneezing:no Past medical history: Seasonal allergies: no/asthma:no Smoking history:quit 2001          Review of Systems     Objective:   Physical Exam General appearance is of good health and nourishment; no acute distress or increased work of breathing is present.  No  lymphadenopathy about the head, neck, or axilla noted.   Eyes: No conjunctival inflammation or lid edema is present.   Ears:  External ear exam shows no significant lesions or deformities.  Otoscopic examination reveals clear canals, tympanic membranes are intact bilaterally without bulging, retraction, inflammation or discharge.  Nose:  External nasal examination shows no deformity or inflammation. Nasal mucosa are dry & slightly erythematous without lesions or exudates. Slight septal dislocation to R.No significant obstruction to airflow.   Oral exam: Dental hygiene is good; lips and gums are healthy appearing.There is no oropharyngeal erythema or exudate noted.     Heart:  Normal rate and regular rhythm. S1 and S2 normal without gallop, murmur, click, rub or other extra sounds.   Lungs:Chest clear to auscultation; no wheezes, rhonchi,rales ,or rubs present.No increased work of breathing.    Extremities:  No cyanosis, edema, or clubbing  noted    Skin: Warm & dry             Assessment & Plan:  #1 left maxillary sinusitis  #2 bronchitis, acute Plan: See orders and recommendations

## 2011-10-13 NOTE — Patient Instructions (Signed)
Plain Mucinex for thick secretions ;force NON dairy fluids for next 48 hrs. Use a Neti pot daily as needed for sinus congestion 

## 2011-11-15 ENCOUNTER — Other Ambulatory Visit: Payer: Self-pay | Admitting: Internal Medicine

## 2011-11-16 NOTE — Telephone Encounter (Signed)
This is not a maintenance medication; refill without definite medication is not indicated.

## 2011-11-16 NOTE — Telephone Encounter (Signed)
Dr.Hopper please advise 

## 2011-12-28 ENCOUNTER — Ambulatory Visit (INDEPENDENT_AMBULATORY_CARE_PROVIDER_SITE_OTHER): Payer: 59 | Admitting: *Deleted

## 2011-12-28 DIAGNOSIS — Z23 Encounter for immunization: Secondary | ICD-10-CM

## 2012-01-18 ENCOUNTER — Ambulatory Visit (INDEPENDENT_AMBULATORY_CARE_PROVIDER_SITE_OTHER): Payer: 59 | Admitting: Internal Medicine

## 2012-01-18 ENCOUNTER — Encounter: Payer: Self-pay | Admitting: Internal Medicine

## 2012-01-18 VITALS — BP 96/60 | HR 77 | Temp 98.7°F | Resp 12 | Ht 67.0 in | Wt 142.0 lb

## 2012-01-18 DIAGNOSIS — Z2911 Encounter for prophylactic immunotherapy for respiratory syncytial virus (RSV): Secondary | ICD-10-CM

## 2012-01-18 DIAGNOSIS — M25559 Pain in unspecified hip: Secondary | ICD-10-CM

## 2012-01-18 DIAGNOSIS — M899 Disorder of bone, unspecified: Secondary | ICD-10-CM

## 2012-01-18 DIAGNOSIS — Z23 Encounter for immunization: Secondary | ICD-10-CM

## 2012-01-18 DIAGNOSIS — Z0000 Encounter for general adult medical examination without abnormal findings: Secondary | ICD-10-CM

## 2012-01-18 DIAGNOSIS — M8580 Other specified disorders of bone density and structure, unspecified site: Secondary | ICD-10-CM

## 2012-01-18 LAB — TSH: TSH: 1.48 u[IU]/mL (ref 0.35–5.50)

## 2012-01-18 LAB — BASIC METABOLIC PANEL
BUN: 12 mg/dL (ref 6–23)
CO2: 32 mEq/L (ref 19–32)
Calcium: 8.9 mg/dL (ref 8.4–10.5)
Creatinine, Ser: 0.6 mg/dL (ref 0.4–1.2)

## 2012-01-18 LAB — LIPID PANEL
Cholesterol: 232 mg/dL — ABNORMAL HIGH (ref 0–200)
HDL: 79.4 mg/dL (ref >39.00)
Triglycerides: 37 mg/dL (ref 0.0–149.0)

## 2012-01-18 LAB — CBC WITH DIFFERENTIAL/PLATELET
Basophils Relative: 0.6 % (ref 0.0–3.0)
Eosinophils Absolute: 0.1 10*3/uL (ref 0.0–0.7)
Lymphocytes Relative: 41.6 % (ref 12.0–46.0)
MCHC: 34.8 g/dL (ref 30.0–36.0)
Monocytes Absolute: 0.2 10*3/uL (ref 0.1–1.0)
Neutrophils Relative %: 47.5 % (ref 43.0–77.0)
Platelets: 278 10*3/uL (ref 150.0–400.0)
RBC: 4.03 Mil/uL (ref 3.87–5.11)
WBC: 3 10*3/uL — ABNORMAL LOW (ref 4.5–10.5)

## 2012-01-18 LAB — HEPATIC FUNCTION PANEL
ALT: 18 U/L (ref 0–35)
AST: 21 U/L (ref 0–37)
Albumin: 4.1 g/dL (ref 3.5–5.2)
Total Bilirubin: 0.7 mg/dL (ref 0.3–1.2)
Total Protein: 7 g/dL (ref 6.0–8.3)

## 2012-01-18 LAB — LDL CHOLESTEROL, DIRECT: Direct LDL: 131.1 mg/dL

## 2012-01-18 NOTE — Patient Instructions (Signed)
Preventive Health Care: Exercise  30-45  minutes a day, 3-4 days a week. Walking is especially valuable in preventing Osteoporosis. Eat a low-fat diet with lots of fruits and vegetables, up to 7-9 servings per day.Consume less than 30 grams of sugar per day from foods & drinks with High Fructose Corn Syrup as # 1,2,3 or #4 on label.  

## 2012-01-18 NOTE — Progress Notes (Signed)
Subjective:    Patient ID: Alicia Hensley, female    DOB: 01-19-1961, 51 y.o.   MRN: [id number]  HPI  Mrs. Pena is here for a physical; she denies acute issues except intermittent L hip pain.       Review of Systems  Patient reports no significant  vision/ hearing  changes, adenopathy,fever, weight change,  persistant / recurrent hoarseness , swallowing issues, chest pain,palpitations,edema,persistant /recurrent cough, hemoptysis, dyspnea( rest/ exertional/paroxysmal nocturnal), gastrointestinal bleeding(melena, rectal bleeding), abdominal pain, significant heartburn,  bowel changes,GU symptoms(dysuria, hematuria,pyuria, incontinence), Gyn symptoms(abnormal  bleeding , pain),  syncope, focal weakness, memory loss,numbness & tingling, skin/hair /nail changes,abnormal bruising or bleeding, anxiety,or depression.   The hip pain is described as a "discomfort" which is positional. She is not taking any medications for this.     Objective:   Physical Exam Gen.: Thin but healthy and well-nourished in appearance. Alert, appropriate and cooperative throughout exam. Head: Normocephalic without obvious abnormalities Eyes: No corneal or conjunctival inflammation noted. Pupils equal round reactive to light and accommodation. Fundal exam is benign without hemorrhages, exudate, papilledema. Extraocular motion intact. Vision grossly normal but she was squinting. Ears: External  ear exam reveals no significant lesions or deformities. Canals clear .TMs normal. Hearing is grossly normal bilaterally. Nose: External nasal exam reveals no deformity or inflammation. Nasal mucosa are pink and moist. No lesions or exudates noted.   Mouth: Oral mucosa and oropharynx reveal no lesions or exudates. Teeth in good repair. Neck: No deformities, masses, or tenderness noted. Range of motion &  Thyroid normal Lungs: Normal respiratory effort; chest expands symmetrically. Lungs are clear to auscultation without rales, wheezes,  or increased work of breathing. Heart: Normal rate and rhythm. Normal S1 and S2. No gallop, click, or rub. S 4 with slight slurring; no  murmur. Abdomen: Bowel sounds normal; abdomen soft and nontender. No masses, organomegaly or hernias noted. Genitalia: Dr Ambrose Mantle, [place] [facility]   .                                                                                   Musculoskeletal/extremities: No deformity or scoliosis noted of  the thoracic or lumbar spine. No clubbing, cyanosis, edema, or deformity noted. Range of motion  normal; there is no significant crepitus of the left knee. There are no findings @ the left hip .Tone & strength  normal.Joints normal. Nail health  good. Vascular: Carotid, radial artery, dorsalis pedis and  posterior tibial pulses are full and equal. No bruits present. Neurologic: Alert and oriented x3. Deep tendon reflexes symmetrical and normal.          Skin: Intact without suspicious lesions or rashes. Lymph: No cervical, axillary lymphadenopathy present. Psych: Mood and affect are normal. Normally interactive  Assessment & Plan:  #1 comprehensive physical exam; no acute findings #2 see Problem List with Assessments & Recommendations  #3 positional left hip discomfort. Nautilus exercises recommended; if symptoms persist or progress physical therapy recommended. Plan: see Orders

## 2012-01-19 LAB — VITAMIN D 25 HYDROXY (VIT D DEFICIENCY, FRACTURES): Vit D, 25-Hydroxy: 56 ng/mL (ref 30–89)

## 2012-01-22 ENCOUNTER — Other Ambulatory Visit: Payer: Self-pay | Admitting: Obstetrics and Gynecology

## 2012-01-22 DIAGNOSIS — Z1231 Encounter for screening mammogram for malignant neoplasm of breast: Secondary | ICD-10-CM

## 2012-02-19 ENCOUNTER — Ambulatory Visit
Admission: RE | Admit: 2012-02-19 | Discharge: 2012-02-19 | Disposition: A | Discharge status: Home / Self Care | Payer: 59 | Source: Ambulatory Visit | Attending: Obstetrics and Gynecology | Admitting: Obstetrics and Gynecology

## 2012-02-19 DIAGNOSIS — Z1231 Encounter for screening mammogram for malignant neoplasm of breast: Secondary | ICD-10-CM

## 2012-03-05 ENCOUNTER — Encounter: Payer: 59 | Admitting: Internal Medicine

## 2012-03-14 ENCOUNTER — Ambulatory Visit (INDEPENDENT_AMBULATORY_CARE_PROVIDER_SITE_OTHER)
Admission: RE | Admit: 2012-03-14 | Discharge: 2012-03-14 | Disposition: A | Discharge status: Home / Self Care | Payer: 59 | Source: Ambulatory Visit

## 2012-03-14 DIAGNOSIS — Z0000 Encounter for general adult medical examination without abnormal findings: Secondary | ICD-10-CM

## 2012-03-14 DIAGNOSIS — M810 Age-related osteoporosis without current pathological fracture: Secondary | ICD-10-CM

## 2012-06-24 ENCOUNTER — Ambulatory Visit (INDEPENDENT_AMBULATORY_CARE_PROVIDER_SITE_OTHER): Payer: 59 | Admitting: Internal Medicine

## 2012-06-24 ENCOUNTER — Encounter: Payer: Self-pay | Admitting: Internal Medicine

## 2012-06-24 VITALS — BP 118/76 | HR 105 | Temp 98.3°F | Wt 143.8 lb

## 2012-06-24 DIAGNOSIS — R259 Unspecified abnormal involuntary movements: Secondary | ICD-10-CM

## 2012-06-24 DIAGNOSIS — M79609 Pain in unspecified limb: Secondary | ICD-10-CM

## 2012-06-24 DIAGNOSIS — R253 Fasciculation: Secondary | ICD-10-CM

## 2012-06-24 DIAGNOSIS — M79605 Pain in left leg: Secondary | ICD-10-CM

## 2012-06-24 NOTE — Progress Notes (Signed)
  Subjective:    Patient ID: Alicia Hensley, female    DOB: March 20, 1961, 51 y.o.   MRN: [id number]  HPI In November of 2012 she noted some twitching and fluttering in her legs after standing most of  a day @ a trade show. This resolved after a week without treatment but has recurred in the last 3-4   weeks. The symptoms are intermittent in occur from the hips down. Crossing her legs or sitting in a car may aggravate symptoms. Stretching does appear to help the symptoms. She is not having symptoms at night when supine except for last night slightly.  She is not on a diuretic or statin; she does take a supplemental calcium and vitamin D. in January of this year her vitamin D level, potassium, and calcium were all normal. She does take a supplement to enhance bowel cleansing. She has not had diarrhea with this preparation  Review of Systems She has no associated numbness, tingling, leg weakness, urinary or stool incontinence. She also has had no fever, chills, sweats, or unexplained weight loss.      Objective:   Physical Exam  She appears healthy and well-nourished  Deep tendon reflexes, strength, and tone are all normal.  Range of motion of the lower extremities is normal.  Skin reveals no ischemic changes. Homans sign is negative bilaterally  All pulses are intact        Assessment & Plan:  #1 apparent fasciculations in the legs  Plan: I would encourage you to continue stretching. Calcium, potassium, magnesium, CK, and vitamin D level will be checked

## 2012-06-24 NOTE — Patient Instructions (Addendum)
Please try to go on My Chart within the next 24 hours to allow me to release the results directly to you.  

## 2012-06-25 ENCOUNTER — Telehealth: Payer: Self-pay | Admitting: Internal Medicine

## 2012-06-25 ENCOUNTER — Ambulatory Visit: Payer: 59 | Admitting: Internal Medicine

## 2012-06-25 LAB — CALCIUM: Calcium: 9 mg/dL (ref 8.4–10.5)

## 2012-06-25 LAB — CK: Total CK: 85 U/L (ref 7–177)

## 2012-06-25 LAB — POTASSIUM: Potassium: 4.1 mEq/L (ref 3.5–5.1)

## 2012-06-25 LAB — VITAMIN D 25 HYDROXY (VIT D DEFICIENCY, FRACTURES): Vit D, 25-Hydroxy: 55 ng/mL (ref 30–89)

## 2012-06-25 LAB — MAGNESIUM: Magnesium: 1.9 mg/dL (ref 1.5–2.5)

## 2012-06-25 NOTE — Telephone Encounter (Signed)
The history does not suggest restless leg syndrome as this is usually  a nocturnal event which requires individual to get up and pace for relief

## 2012-06-25 NOTE — Telephone Encounter (Signed)
Patient stated she was told labs would be available on MyChart within 24-hrs of draw, please call when ready [phone]

## 2012-06-25 NOTE — Telephone Encounter (Signed)
I spoke with patient and informed her that Dr.Hopper will address labs and send them through MyChart. Patient was informed that lab values were normal. Patient would still like them sent through [facility] and questions if Dr.Hopper has any recommendations. Patient questions if she has RLS?  Dr.Hopper please advise

## 2012-06-26 NOTE — Telephone Encounter (Signed)
Left message on voicemail with Dr.Hopper's response. Patient informed labs were addressed

## 2012-12-18 LAB — HM PAP SMEAR

## 2012-12-20 ENCOUNTER — Other Ambulatory Visit: Payer: Self-pay | Admitting: Obstetrics and Gynecology

## 2012-12-20 DIAGNOSIS — Z1231 Encounter for screening mammogram for malignant neoplasm of breast: Secondary | ICD-10-CM

## 2013-01-07 ENCOUNTER — Ambulatory Visit (INDEPENDENT_AMBULATORY_CARE_PROVIDER_SITE_OTHER): Payer: 59

## 2013-01-07 DIAGNOSIS — Z23 Encounter for immunization: Secondary | ICD-10-CM

## 2013-01-24 ENCOUNTER — Ambulatory Visit: Payer: 59

## 2013-02-20 ENCOUNTER — Ambulatory Visit
Admission: RE | Admit: 2013-02-20 | Discharge: 2013-02-20 | Disposition: A | Discharge status: Home / Self Care | Payer: 59 | Source: Ambulatory Visit | Attending: Obstetrics and Gynecology | Admitting: Obstetrics and Gynecology

## 2013-02-20 DIAGNOSIS — Z1231 Encounter for screening mammogram for malignant neoplasm of breast: Secondary | ICD-10-CM

## 2013-03-31 ENCOUNTER — Other Ambulatory Visit: Payer: 59

## 2013-04-08 ENCOUNTER — Telehealth: Payer: Self-pay | Admitting: Internal Medicine

## 2013-04-08 DIAGNOSIS — Z0000 Encounter for general adult medical examination without abnormal findings: Secondary | ICD-10-CM

## 2013-04-08 DIAGNOSIS — M8580 Other specified disorders of bone density and structure, unspecified site: Secondary | ICD-10-CM

## 2013-04-08 DIAGNOSIS — E785 Hyperlipidemia, unspecified: Secondary | ICD-10-CM

## 2013-04-08 NOTE — Telephone Encounter (Signed)
Patient would like to have labs done prior to CPE on 06/10/13. She states her insurance will cover. She will be coming to [facility].

## 2013-04-08 NOTE — Telephone Encounter (Signed)
Orders placed.

## 2013-04-10 ENCOUNTER — Encounter: Payer: 59 | Admitting: Internal Medicine

## 2013-06-09 ENCOUNTER — Telehealth: Payer: Self-pay | Admitting: Internal Medicine

## 2013-06-09 NOTE — Telephone Encounter (Signed)
Called patient to remind her of her appointment and she is insisting on doing labs one week prior to appointment. Please order labs and send to scheduler there or back to me so we can schedule lab appointment. Thanks!

## 2013-06-10 ENCOUNTER — Encounter: Payer: 59 | Admitting: Internal Medicine

## 2013-06-17 NOTE — Telephone Encounter (Signed)
Patient has future labs orders already. See phone note dated 04/08/13

## 2013-07-16 ENCOUNTER — Ambulatory Visit (INDEPENDENT_AMBULATORY_CARE_PROVIDER_SITE_OTHER): Payer: 59 | Admitting: Internal Medicine

## 2013-07-16 ENCOUNTER — Encounter: Payer: Self-pay | Admitting: Internal Medicine

## 2013-07-16 VITALS — BP 130/80 | HR 89 | Resp 12 | Ht 65.08 in | Wt 149.0 lb

## 2013-07-16 DIAGNOSIS — Z87410 Personal history of cervical dysplasia: Secondary | ICD-10-CM | POA: Insufficient documentation

## 2013-07-16 DIAGNOSIS — M8580 Other specified disorders of bone density and structure, unspecified site: Secondary | ICD-10-CM

## 2013-07-16 DIAGNOSIS — E559 Vitamin D deficiency, unspecified: Secondary | ICD-10-CM | POA: Insufficient documentation

## 2013-07-16 DIAGNOSIS — Z0000 Encounter for general adult medical examination without abnormal findings: Secondary | ICD-10-CM

## 2013-07-16 DIAGNOSIS — Z23 Encounter for immunization: Secondary | ICD-10-CM

## 2013-07-16 DIAGNOSIS — E785 Hyperlipidemia, unspecified: Secondary | ICD-10-CM

## 2013-07-16 DIAGNOSIS — M899 Disorder of bone, unspecified: Secondary | ICD-10-CM

## 2013-07-16 NOTE — Patient Instructions (Addendum)
Preventive Health Care: Exercise  30-45  minutes a day, 3-4 days a week. Walking is especially valuable in preventing Osteoporosis. Eat a low-fat diet with lots of fruits and vegetables, up to 7-9 servings per day. This would eliminate need for vitamin supplements for most individuals. Consume less than 30 grams of sugar per day from foods & drinks with High Fructose Corn Syrup as # 1,2,3 or #4 on label.  

## 2013-07-16 NOTE — Progress Notes (Signed)
  Subjective:    Patient ID: Alicia Hensley, female    DOB: 1961-03-02, 52 y.o.   MRN: [id number]  HPI  She is here for a physical;acute issues denied.     Review of Systems  She is asymptomatic; she's concerned about lipomas of the extremities which apparently run in the family. Her mother had one such lesion removed; but this left a postoperative scar  She's had an elevated LDL  but advanced cholesterol testing reveals no risk unless LDL is over 160.  She does have osteopenia at the left femoral neck with T score of -1.6. She has had vitamin D deficiency but this has been corrected with supplements.  Her colonoscopy is up to date; apparently she previously  had adenomatous polyps as repeat is due in 2017.     Objective:   Physical Exam Gen.: Healthy and well-nourished in appearance. Alert, appropriate and cooperative throughout exam.Appears younger than stated age  Head: Normocephalic without obvious abnormalities  Eyes: No corneal or conjunctival inflammation noted.  Extraocular motion intact. Vision grossly decreased without lenses Ears: External  ear exam reveals no significant lesions or deformities. Canals clear .TMs normal. Hearing is grossly normal bilaterally. Nose: External nasal exam reveals no deformity or inflammation. Nasal mucosa are pink and moist. No lesions or exudates noted.  Mouth: Oral mucosa and oropharynx reveal no lesions or exudates. Teeth in good repair. Neck: No deformities, masses, or tenderness noted. Range of motion & Thyroid normal. Lungs: Normal respiratory effort; chest expands symmetrically. Lungs are clear to auscultation without rales, wheezes, or increased work of breathing. Heart: Normal rate and rhythm. Normal S1 and S2. No gallop, click, or rub. S4 w/o murmur. Abdomen: Bowel sounds normal; abdomen soft and nontender. No masses, organomegaly or hernias noted. Genitalia: As per Gyn                                  Musculoskeletal/extremities: No  deformity or scoliosis noted of  the thoracic or lumbar spine.  No clubbing, cyanosis, edema, or significant extremity  deformity noted. Range of motion normal .Tone & strength  Normal. Joints normal . Nail health good. Able to lie down & sit up w/o help. Negative SLR bilaterally Vascular: Carotid, radial artery, dorsalis pedis and  posterior tibial pulses are full and equal. No bruits present. Neurologic: Alert and oriented x3. Deep tendon reflexes symmetrical and normal.          Skin: Intact without suspicious lesions or rashes. She has small lipomas of the thigh and forearm; the former lesion did transilluminate. Lymph: No cervical, axillary lymphadenopathy present. Psych: Mood and affect are normal. Normally interactive                                                                                        Assessment & Plan:  #1 comprehensive physical exam; no acute findings  Plan: see Orders  & Recommendations

## 2013-07-17 LAB — CBC WITH DIFFERENTIAL/PLATELET
Basophils Relative: 0.5 % (ref 0.0–3.0)
Eosinophils Relative: 1.7 % (ref 0.0–5.0)
HCT: 37.9 % (ref 36.0–46.0)
Hemoglobin: 12.8 g/dL (ref 12.0–15.0)
Lymphs Abs: 1.9 10*3/uL (ref 0.7–4.0)
MCV: 90.8 fl (ref 78.0–100.0)
Monocytes Absolute: 0.3 10*3/uL (ref 0.1–1.0)
Neutro Abs: 2.1 10*3/uL (ref 1.4–7.7)
Neutrophils Relative %: 48.5 % (ref 43.0–77.0)
RBC: 4.18 Mil/uL (ref 3.87–5.11)
WBC: 4.4 10*3/uL — ABNORMAL LOW (ref 4.5–10.5)

## 2013-07-17 LAB — LDL CHOLESTEROL, DIRECT: Direct LDL: 153.7 mg/dL

## 2013-07-17 LAB — LIPID PANEL: Triglycerides: 68 mg/dL (ref 0.0–149.0)

## 2013-07-17 LAB — HEPATIC FUNCTION PANEL
ALT: 19 U/L (ref 0–35)
Bilirubin, Direct: 0.1 mg/dL (ref 0.0–0.3)
Total Protein: 6.7 g/dL (ref 6.0–8.3)

## 2013-07-17 LAB — BASIC METABOLIC PANEL
Chloride: 103 mEq/L (ref 96–112)
Potassium: 3.6 mEq/L (ref 3.5–5.1)
Sodium: 138 mEq/L (ref 135–145)

## 2013-07-17 LAB — TSH: TSH: 1.43 u[IU]/mL (ref 0.35–5.50)

## 2013-07-21 LAB — VITAMIN D 1,25 DIHYDROXY: Vitamin D 1, 25 (OH)2 Total: 67 pg/mL (ref 18–72)

## 2013-10-23 ENCOUNTER — Other Ambulatory Visit: Payer: Self-pay

## 2013-11-19 ENCOUNTER — Encounter: Payer: Self-pay | Admitting: Physician Assistant

## 2013-11-19 ENCOUNTER — Ambulatory Visit (INDEPENDENT_AMBULATORY_CARE_PROVIDER_SITE_OTHER): Payer: 59 | Admitting: Physician Assistant

## 2013-11-19 VITALS — BP 106/68 | HR 76 | Temp 98.0°F | Resp 16 | Ht 65.75 in | Wt 151.8 lb

## 2013-11-19 DIAGNOSIS — L259 Unspecified contact dermatitis, unspecified cause: Secondary | ICD-10-CM

## 2013-11-19 DIAGNOSIS — L239 Allergic contact dermatitis, unspecified cause: Secondary | ICD-10-CM

## 2013-11-19 MED ORDER — PREDNISONE 10 MG PO TABS: ORAL_TABLET | ORAL | Status: DC

## 2013-11-19 NOTE — Progress Notes (Signed)
Patient ID: Alicia Hensley, female   DOB: [birth year], 52 y.o.   MRN: [id number]  Patient presents to clinic today c/o pruritic rash of torso, back and upper extremities x 3 days, that has extended to lower extremities.  Patient endorses recent change in both soap and laundry detergent within the past week.  Patient denies exposure to rhus plant.  Denies recent new medication.  Patient has not taken anything for itch.  Patient denies sick contact.   Past Medical History  Diagnosis Date  . Basal cell cancer     back & RUE; Dr Danella Deis  . Hyperlipidemia   . Diverticulosis   . Colon polyps     Current Outpatient Prescriptions on File Prior to Visit  Medication Sig Dispense Refill  . cholecalciferol (VITAMIN D) 1000 UNITS tablet Take 1,000 Units by mouth daily.        . vitamin E 400 UNIT capsule Take 400 Units by mouth daily.         No current facility-administered medications on file prior to visit.    Allergies  Allergen Reactions  . Biotin Itching and Dermatitis    Family History  Problem Relation Age of Onset  . Hypertension Mother   . Colon polyps Mother     pre cancerous polyps  . Thyroid nodules Sister     benign  . Colon cancer      3 maternal great aunts  . Diabetes Neg Hx   . Stroke Neg Hx   . Heart attack Neg Hx     History   Social History  . Marital Status: Married    Spouse Name: N/A    Number of Children: N/A  . Years of Education: N/A   Occupational History  . retail    Social History Main Topics  . Smoking status: Former Smoker    Quit date: 12/18/2001  . Smokeless tobacco: None     Comment: smoked 1980-2003, up to 1/2 ppd  . Alcohol Use: 4.2 oz/week    7 Glasses of wine per week  . Drug Use: No  . Sexual Activity: None   Other Topics Concern  . None   Social History Narrative  . None   Review of Systems - see HPI.  All other ROS are negative.  Filed Vitals:   11/19/13 1126  BP: 106/68  Pulse: 76  Temp: 98 F (36.7 C)  Resp: 16    Physical Exam  Constitutional: She is oriented to person, place, and time and well-developed, well-nourished, and in no distress.  HENT:  Head: Normocephalic and atraumatic.  Eyes: Conjunctivae are normal.  Neck: Neck supple.  Cardiovascular: Normal rate, regular rhythm and normal heart sounds.   Pulmonary/Chest: Effort normal and breath sounds normal.  Lymphadenopathy:    She has no cervical adenopathy.  Neurological: She is alert and oriented to person, place, and time. No cranial nerve deficit.  Skin: Skin is warm and dry.  Presence of a scattered macular rash over torso, back, and upper extremities, sparing the face.  Skin of lower extremities is pruritic, but no rash noted at time of examination.  Skin exhibits no sign of excoriation or superimposed infection.   Assessment/Plan: Allergic contact dermatitis Rx Prednisone taper.  Benadryl at bedtime.  Claritin in am.  Sarna lotion.  Cool compresses for itch.  Rewash clothing in a different detergent that has been tolerated before.  Avoid [place] soap that patient is currently using.  Attempt washing with a non-perfumed, non-dyed  soap like Dove or Cetaphil.

## 2013-11-19 NOTE — Patient Instructions (Signed)
Please take prednisone following instructions on the bottle.  Avoid excess heat or sunlight exposure.  Take an antihistamine like claritin in the am and benadryl at bedtime.  OTC sarna lotion for itch.  Cool compresses will also help with itch.  Please return if symptoms are not improving.

## 2013-11-19 NOTE — Assessment & Plan Note (Signed)
Rx Prednisone taper.  Benadryl at bedtime.  Claritin in am.  Sarna lotion.  Cool compresses for itch.  Rewash clothing in a different detergent that has been tolerated before.  Avoid [place] soap that patient is currently using.  Attempt washing with a non-perfumed, non-dyed soap like Dove or Cetaphil.

## 2013-12-31 ENCOUNTER — Ambulatory Visit (INDEPENDENT_AMBULATORY_CARE_PROVIDER_SITE_OTHER): Payer: 59 | Admitting: Internal Medicine

## 2013-12-31 ENCOUNTER — Encounter: Payer: Self-pay | Admitting: Internal Medicine

## 2013-12-31 VITALS — BP 108/69 | HR 79 | Temp 99.3°F | Wt 150.2 lb

## 2013-12-31 DIAGNOSIS — J209 Acute bronchitis, unspecified: Secondary | ICD-10-CM

## 2013-12-31 DIAGNOSIS — Z23 Encounter for immunization: Secondary | ICD-10-CM

## 2013-12-31 MED ORDER — AZITHROMYCIN 250 MG PO TABS: ORAL_TABLET | ORAL | Status: DC

## 2013-12-31 NOTE — Progress Notes (Signed)
   Subjective:    Patient ID: Alicia Hensley, female    DOB: Nov 29, 1961, 53 y.o.   MRN: [id number]  HPI   Presents with cough since 12/25 with production of green sputum which is decreasing in quanity. She did have some shortness of breath and wheezing but this has resolved. Symptoms moderately relieved with increased fluids and Tylenol. Reports mild L ear pain since yesterday. Denies fever, chills, sweats.  No past medical history of asthma. Smoking discontinued 2003.   Review of Systems   She denies extrinsic symptoms of itchy, watery eyes, sneezing.  She has no frontal headache, facial pain, nasal purulence, dental pain, or sore throat.     Objective:   Physical Exam General appearance:good health ;well nourished; no acute distress or increased work of breathing is present.  No  lymphadenopathy about the head, neck, or axilla noted.   Eyes: No conjunctival inflammation or lid edema is present.   Ears:  External ear exam shows no significant lesions or deformities.  Otoscopic examination reveals clear canals, tympanic membranes are intact bilaterally without bulging, retraction, inflammation or discharge.  Nose:  External nasal examination shows no deformity or inflammation. Nasal mucosa are pink and moist without lesions or exudates. No septal dislocation or deviation.No obstruction to airflow.   Oral exam: Dental hygiene is good; lips and gums are healthy appearing.There is no oropharyngeal erythema or exudate noted.   Neck:  No deformities,  masses, or tenderness noted.     Heart:  Normal rate and regular rhythm. S1 and S2 normal without gallop, murmur, click, rub or other extra sounds.   Lungs:Chest clear to auscultation; no wheezes, rhonchi,rales ,or rubs present.No increased work of breathing.    Extremities:  No cyanosis, edema, or clubbing  noted    Skin: Warm & dry .         Assessment & Plan:  #1 acute bronchitis w/o bronchospasm  Plan: See orders and  recommendations

## 2013-12-31 NOTE — Patient Instructions (Signed)
Go to [facility] for eustachian tube dysfunction. Drink thin  fluids liberally through the day and chew sugarless gum . Do the Valsalva maneuver several times a day to "pop" ears open. Nasacort AQ 1 spray in each nostril twice a day as needed. Use the "crossover" technique as discussed. Use a Neti pot daily- 2x /day  as needed for sinus congestion

## 2013-12-31 NOTE — Progress Notes (Signed)
Pre visit review using our clinic review tool, if applicable. No additional management support is needed unless otherwise documented below in the visit note. 

## 2014-01-13 ENCOUNTER — Other Ambulatory Visit: Payer: Self-pay

## 2014-01-13 DIAGNOSIS — Z1231 Encounter for screening mammogram for malignant neoplasm of breast: Secondary | ICD-10-CM

## 2014-01-29 ENCOUNTER — Observation Stay (HOSPITAL_COMMUNITY)
Admission: EM | Admit: 2014-01-29 | Discharge: 2014-01-30 | Disposition: A | Discharge status: Home / Self Care | Payer: 59 | Attending: Internal Medicine | Admitting: Internal Medicine

## 2014-01-29 ENCOUNTER — Encounter (HOSPITAL_COMMUNITY): Payer: Self-pay | Admitting: Emergency Medicine

## 2014-01-29 ENCOUNTER — Emergency Department (HOSPITAL_COMMUNITY): Payer: 59

## 2014-01-29 DIAGNOSIS — G459 Transient cerebral ischemic attack, unspecified: Secondary | ICD-10-CM

## 2014-01-29 DIAGNOSIS — R0602 Shortness of breath: Secondary | ICD-10-CM | POA: Insufficient documentation

## 2014-01-29 DIAGNOSIS — Z85828 Personal history of other malignant neoplasm of skin: Secondary | ICD-10-CM | POA: Insufficient documentation

## 2014-01-29 DIAGNOSIS — Z87891 Personal history of nicotine dependence: Secondary | ICD-10-CM | POA: Insufficient documentation

## 2014-01-29 DIAGNOSIS — E785 Hyperlipidemia, unspecified: Secondary | ICD-10-CM | POA: Insufficient documentation

## 2014-01-29 DIAGNOSIS — R05 Cough: Secondary | ICD-10-CM | POA: Insufficient documentation

## 2014-01-29 DIAGNOSIS — E559 Vitamin D deficiency, unspecified: Secondary | ICD-10-CM | POA: Diagnosis present

## 2014-01-29 DIAGNOSIS — K5730 Diverticulosis of large intestine without perforation or abscess without bleeding: Secondary | ICD-10-CM

## 2014-01-29 DIAGNOSIS — R059 Cough, unspecified: Secondary | ICD-10-CM | POA: Insufficient documentation

## 2014-01-29 DIAGNOSIS — J3489 Other specified disorders of nose and nasal sinuses: Secondary | ICD-10-CM | POA: Insufficient documentation

## 2014-01-29 DIAGNOSIS — I6529 Occlusion and stenosis of unspecified carotid artery: Secondary | ICD-10-CM | POA: Insufficient documentation

## 2014-01-29 DIAGNOSIS — R200 Anesthesia of skin: Secondary | ICD-10-CM

## 2014-01-29 DIAGNOSIS — R209 Unspecified disturbances of skin sensation: Principal | ICD-10-CM | POA: Insufficient documentation

## 2014-01-29 LAB — BASIC METABOLIC PANEL
BUN: 12 mg/dL (ref 6–23)
CALCIUM: 9.2 mg/dL (ref 8.4–10.5)
CO2: 26 mEq/L (ref 19–32)
Chloride: 99 mEq/L (ref 96–112)
Creatinine, Ser: 0.8 mg/dL (ref 0.50–1.10)
GFR calc Af Amer: >90 mL/min (ref >90)
GFR, EST NON AFRICAN AMERICAN: 83 mL/min — AB (ref >90)
GLUCOSE: 98 mg/dL (ref 70–99)
Potassium: 4.1 mEq/L (ref 3.7–5.3)
SODIUM: 140 meq/L (ref 137–147)

## 2014-01-29 LAB — CBC
HCT: 37 % (ref 36.0–46.0)
Hemoglobin: 13 g/dL (ref 12.0–15.0)
MCH: 31.1 pg (ref 26.0–34.0)
MCHC: 35.1 g/dL (ref 30.0–36.0)
MCV: 88.5 fL (ref 78.0–100.0)
Platelets: 295 10*3/uL (ref 150–400)
RBC: 4.18 MIL/uL (ref 3.87–5.11)
RDW: 12.5 % (ref 11.5–15.5)
WBC: 5.6 10*3/uL (ref 4.0–10.5)

## 2014-01-29 LAB — POCT I-STAT TROPONIN I: Troponin i, poc: 0.01 ng/mL (ref 0.00–0.08)

## 2014-01-29 NOTE — ED Notes (Addendum)
Pt reports left sided arm tingling from shoulder to finger tips for past couple of hours constant; Pt reports mild SOB but no signs of distress. Neurologically intact with no deficits; Denies chest pain. Pt had URI for 8 weeks recently. Residual cough. No cardiac hx.

## 2014-01-30 ENCOUNTER — Observation Stay (HOSPITAL_COMMUNITY): Payer: 59

## 2014-01-30 DIAGNOSIS — R200 Anesthesia of skin: Secondary | ICD-10-CM | POA: Diagnosis present

## 2014-01-30 DIAGNOSIS — E785 Hyperlipidemia, unspecified: Secondary | ICD-10-CM

## 2014-01-30 DIAGNOSIS — K5730 Diverticulosis of large intestine without perforation or abscess without bleeding: Secondary | ICD-10-CM

## 2014-01-30 DIAGNOSIS — R209 Unspecified disturbances of skin sensation: Secondary | ICD-10-CM

## 2014-01-30 DIAGNOSIS — G459 Transient cerebral ischemic attack, unspecified: Secondary | ICD-10-CM | POA: Diagnosis present

## 2014-01-30 DIAGNOSIS — E559 Vitamin D deficiency, unspecified: Secondary | ICD-10-CM

## 2014-01-30 LAB — LIPID PANEL
CHOL/HDL RATIO: 2.6 ratio
Cholesterol: 237 mg/dL — ABNORMAL HIGH (ref 0–200)
HDL: 90 mg/dL (ref >39)
LDL Cholesterol: 133 mg/dL — ABNORMAL HIGH (ref 0–99)
Triglycerides: 71 mg/dL (ref <150)
VLDL: 14 mg/dL (ref 0–40)

## 2014-01-30 MED ORDER — SODIUM CHLORIDE 0.9 % IJ SOLN: 3.0000 mL | Freq: Two times a day (BID) | INTRAMUSCULAR | Status: DC

## 2014-01-30 MED ORDER — SIMVASTATIN 20 MG PO TABS: 10.0000 mg | ORAL_TABLET | Freq: Every day | ORAL | Status: DC

## 2014-01-30 MED ORDER — ENOXAPARIN SODIUM 40 MG/0.4ML ~~LOC~~ SOLN
40.0000 mg | Freq: Every day | SUBCUTANEOUS | Status: DC
  Administered 2014-01-30: 40 mg via SUBCUTANEOUS
  Filled 2014-01-30: qty 0.4

## 2014-01-30 MED ORDER — ASPIRIN 81 MG PO CHEW
162.0000 mg | CHEWABLE_TABLET | Freq: Once | ORAL | Status: AC
  Administered 2014-01-30: 162 mg via ORAL
  Filled 2014-01-30: qty 2

## 2014-01-30 MED ORDER — ASPIRIN 81 MG PO TBEC: 81.0000 mg | DELAYED_RELEASE_TABLET | Freq: Every day | ORAL | Status: DC

## 2014-01-30 MED ORDER — WHITE PETROLATUM GEL
Status: AC
  Administered 2014-01-30: 06:00:00
  Filled 2014-01-30: qty 5

## 2014-01-30 MED ORDER — ASPIRIN EC 81 MG PO TBEC
81.0000 mg | DELAYED_RELEASE_TABLET | Freq: Every day | ORAL | Status: DC
  Administered 2014-01-30: 81 mg via ORAL
  Filled 2014-01-30: qty 1

## 2014-01-30 MED ORDER — SODIUM CHLORIDE 0.9 % IJ SOLN: 3.0000 mL | INTRAMUSCULAR | Status: DC | PRN

## 2014-01-30 MED ORDER — SODIUM CHLORIDE 0.9 % IV SOLN: 250.0000 mL | INTRAVENOUS | Status: DC | PRN

## 2014-01-30 MED ORDER — GADOBENATE DIMEGLUMINE 529 MG/ML IV SOLN
15.0000 mL | Freq: Once | INTRAVENOUS | Status: AC | PRN
  Administered 2014-01-30: 13 mL via INTRAVENOUS

## 2014-01-30 MED ORDER — ACETAMINOPHEN 325 MG PO TABS
650.0000 mg | ORAL_TABLET | ORAL | Status: DC | PRN
Start: 2014-01-30 — End: 2014-01-30
  Administered 2014-01-30: 650 mg via ORAL
  Filled 2014-01-30: qty 2

## 2014-01-30 MED ORDER — SIMVASTATIN 20 MG PO TABS: 20.0000 mg | ORAL_TABLET | Freq: Every day | ORAL | Status: DC

## 2014-01-30 NOTE — Progress Notes (Signed)
VASCULAR LAB PRELIMINARY  PRELIMINARY  PRELIMINARY  PRELIMINARY  Carotid duplex  completed.    Preliminary report:  Bilateral:  1-39% ICA stenosis.  Vertebral artery flow is antegrade.      Harvest Deist, RVT 01/30/2014, 11:51 AM

## 2014-01-30 NOTE — Progress Notes (Signed)
2nd attempt to call RN for report.

## 2014-01-30 NOTE — Progress Notes (Signed)
Progress Note With repeat symptoms leading to admission it is felt that a stroke work up is most appropriate.  Patient on telemetry.  Would have an echocardiogram and carotid dopplers performed.  This was explained to patient and family. Agree with ASA at discharge.   Alexis Goodell, MD [facility] [phone]

## 2014-01-30 NOTE — Progress Notes (Signed)
PT Cancellation Note  Patient Details Name: Alicia Hensley MRN: [id number] DOB: 1961/12/11   Cancelled Treatment:    Reason Eval/Treat Not Completed: PT screened, no needs identified, will sign off.  Pt's symptoms have resolved.  No PT needs.  PT to sign off.     Barbarann Ehlers [place], [place], DPT [phone]   01/30/2014, 2:07 PM

## 2014-01-30 NOTE — ED Provider Notes (Signed)
CSN: [id number]     Arrival date & time 01/29/14  2005 History   First MD Initiated Contact with Patient 01/29/14 2355     Chief Complaint  Patient presents with  . Numbness     (Consider location/radiation/quality/duration/timing/severity/associated sxs/prior Treatment) HPI History provided by patient. Around 3 PM today he developed left arm numbness, symptoms lasted until about 11 PM. Numbness from shoulders to fingertips, constant.  No difficulty grabbing or holding objects with some subjective weakness but no difficulty moving her arm. At the time of onset has been checking her blood pressure and concerned that it was elevated. No history of same. No trauma. No neck pain. Has had some persistent cough with recent URI symptoms. No chest pain or shortness of breath. Past Medical History  Diagnosis Date  . Basal cell cancer     back & RUE; Dr Tonia Brooms  . Hyperlipidemia   . Diverticulosis   . Colon polyps    Past Surgical History  Procedure Laterality Date  . Throat cyst      resected  . G 2 p 2    . Septal deviation repair    . Wisdom tooth extraction    . Colonoscopy with polypectomy  2012    X 1; diverticulosis; Dr Collene Mares   Family History  Problem Relation Age of Onset  . Hypertension Mother   . Colon polyps Mother     pre cancerous polyps  . Thyroid nodules Sister     benign  . Colon cancer      3 maternal great aunts  . Diabetes Neg Hx   . Stroke Neg Hx   . Heart attack Neg Hx    History  Substance Use Topics  . Smoking status: Former Smoker    Quit date: 12/18/2001  . Smokeless tobacco: Not on file     Comment: smoked 1980-2003, up to 1/2 ppd  . Alcohol Use: 4.2 oz/week    7 Glasses of wine per week   OB History   Grav Para Term Preterm Abortions TAB SAB Ect Mult Living                 Review of Systems  Constitutional: Negative for fever and chills.  Eyes: Negative for visual disturbance.  Respiratory: Negative for shortness of breath.   Cardiovascular:  Negative for chest pain.  Gastrointestinal: Negative for abdominal pain.  Musculoskeletal: Negative for neck pain and neck stiffness.  Skin: Negative for rash.  Neurological: Positive for numbness. Negative for facial asymmetry, speech difficulty and headaches.  All other systems reviewed and are negative.      Allergies  Biotin  Home Medications   Current Outpatient Rx  Name  Route  Sig  Dispense  Refill  . Ascorbic Acid (VITAMIN C) 1000 MG tablet   Oral   Take 1,000 mg by mouth 5 (five) times daily.         Marland Kitchen CALCIUM PO   Oral   Take 2 tablets by mouth at bedtime.         . Multiple Vitamin (MULTIVITAMIN WITH MINERALS) TABS tablet   Oral   Take 1 tablet by mouth at bedtime.         . Tetrahydrozoline HCl (VISINE OP)   Both Eyes   Place 1 drop into both eyes daily as needed (dry eyes/ redness).         . valACYclovir (VALTREX) 500 MG tablet   Oral   Take 1 tablet by mouth See  admin instructions. Take 2 tablets (1000 mg) at onset, then take 1 tablet (500 mg) twice daily for about 3 days          BP 135/76  Pulse 82  Temp(Src) 98.1 F (36.7 C) (Oral)  Resp 15  Ht 5\' 6"  (1.676 m)  Wt 143 lb (64.864 kg)  BMI 23.09 kg/m2  SpO2 98% Physical Exam  Constitutional: She is oriented to person, place, and time. She appears well-developed and well-nourished.  HENT:  Head: Normocephalic and atraumatic.  Eyes: EOM are normal. Pupils are equal, round, and reactive to light.  Neck: Neck supple.  No cervical spine tenderness or deformity  Cardiovascular: Normal rate, regular rhythm and intact distal pulses.   Pulmonary/Chest: Effort normal and breath sounds normal. No respiratory distress.  Musculoskeletal: Normal range of motion. She exhibits no edema.  Neurological: She is alert and oriented to person, place, and time. No cranial nerve deficit. Coordination normal.  No pronator drift. Equal grips, triceps, biceps strengths. Sensorium to light touch intact to  upper extremities. no lower extremity deficits. fine motor function intact upper extremities  Skin: Skin is warm and dry.    ED Course  Procedures (including critical care time) Labs Review Labs Reviewed  BASIC METABOLIC PANEL - Abnormal; Notable for the following:    GFR calc non Af Amer 83 (*)    All other components within normal limits  CBC  POCT I-STAT TROPONIN I   Imaging Review Dg Chest 2 View  01/29/2014   CLINICAL DATA:  Shortness of breath, cough  EXAM: CHEST  2 VIEW  COMPARISON:  None.  FINDINGS: The heart size and mediastinal contours are within normal limits. There is no focal infiltrate, pulmonary edema, or pleural effusion. Nodular densities project in the right lung base consistent with rib end. The visualized skeletal structures are unremarkable.  IMPRESSION: No active cardiopulmonary disease.   Electronically Signed   By: Abelardo Diesel M.D.   On: 01/29/2014 20:42    EKG Interpretation    Date/Time:  Thursday January 29 2014 20:15:21 EST Ventricular Rate:  86 PR Interval:  142 QRS Duration: 78 QT Interval:  360 QTC Calculation: 430 R Axis:   21 Text Interpretation:  Normal sinus rhythm Low voltage QRS No significant change since last tracing Confirmed by Rithvik Orcutt  MD, Alianis Trimmer [phone]) on 01/30/2014 12:17:19 AM           ABCD2 score 3 - BP 140/90, symptoms lasted about 8 hours  12:48 AM d/w NEU Dr Aram Beecham, asymptomatic now - has not had CT scan, agrees with plan TIA work up. ASA provided.   MED consult, d/w Dr Shanon Brow, plan admit   MDM   Dx: left arm numbness  Concern for TIA, symptoms resolved at time of my evaluation.  ECG, CXR and labs reviewed as above NEU consult/ MED admit   Teressa Lower, MD 01/30/14 [phone]

## 2014-01-30 NOTE — Progress Notes (Signed)
  Echocardiogram 2D Echocardiogram has been performed.  Taneasha Fuqua FRANCES 01/30/2014, 12:27 PM

## 2014-01-30 NOTE — Progress Notes (Signed)
Pt discharge instructions and education completed with pt and spouse at bedside. Both denies any questions; all lines including telemetry and IV removed. Pt provided printout on "cholesterol and TIA", "Beyond the hospital" booklet provided to pt. Pt ambulating off the unit with spouse and belongings at side.

## 2014-01-30 NOTE — Progress Notes (Signed)
Returned call to ED RN,3rd attempt to get report.

## 2014-01-30 NOTE — Progress Notes (Signed)
OT Cancellation Note  Patient Details Name: [name] MRN: [id number] DOB: 1961/03/15   Cancelled Treatment:    Reason Eval/Treat Not Completed: OT screened, no needs identified, will sign off Pt's symptoms have resolved.  Almon Register [phone] 01/30/2014, 2:48 PM

## 2014-01-30 NOTE — Progress Notes (Signed)
UR completed 

## 2014-01-30 NOTE — Progress Notes (Signed)
[facility] PROGRESS NOTE  Alicia Hensley JKK:[id number] DOB: 07-28-1961 DOA: 01/29/2014 PCP: Unice Cobble, MD  Assessment/Plan: #1 left upper extremity numbness/questionable TIA Patient preserved left upper extremity numbness lasting 8 hours which has since resolved. Probable TIA. MRI MRA of the head negative for acute infarct. Patient refused head CT on admission. Will check a 2-D echo. Check carotid Dopplers. Check a fasting lipid panel. Continue aspirin for secondary stroke prevention. Neurology following and appreciate input and recommendations.  #2 history of hyperlipidemia Fasting lipid panel from 07/16/2013 shows a total cholesterol of 247 direct LDL of 153.7. Patient states PCP check a lipoprotein panel and stated that she did not have hyperlipidemia. Will repeat a fasting lipid panel. Patient will likely followup with PCP as outpatient in reference to this.  #3 history of diverticulosis Stable.  #4 vitamin D deficiency Outpatient followup.  #5 prophylaxis Lovenox for DVT prophylaxis.  Code Status: Full Family Communication: Updated patient and husband at bedside. Disposition Plan: Home when medically stable pending stroke workup.   Consultants:  Neurology: Dr. Aram Beecham 01/30/2014  Procedures:  MRI MRA head 01/30/2014  Antibiotics:  None  HPI/Subjective: Status left upper extremity numbness has resolved. No complaints. Patient asking when she can leave as they are scheduled to go to [facility] today via car.  Objective: Filed Vitals:   01/30/14 0607  BP: 106/66  Pulse: 75  Temp: 97.7 F (36.5 C)  Resp: 18    Intake/Output Summary (Last 24 hours) at 01/30/14 1000 Last data filed at 01/30/14 0857  Gross per 24 hour  Intake    240 ml  Output      0 ml  Net    240 ml   Filed Weights   01/29/14 2014 01/30/14 0325  Weight: 64.864 kg (143 lb) 67.2 kg (148 lb 2.4 oz)    Exam:   General:  nad  Cardiovascular: rrr  Respiratory: ctab  Abdomen:  Soft, nontender, nondistended positive bowel sounds  Musculoskeletal:  No clubbing, Cyanosis or edema  Data Reviewed: Basic Metabolic Panel:  Recent Labs Lab 01/29/14 2022  NA 140  K 4.1  CL 99  CO2 26  GLUCOSE 98  BUN 12  CREATININE 0.80  CALCIUM 9.2   Liver Function Tests: No results found for this basename: AST, ALT, ALKPHOS, BILITOT, PROT, ALBUMIN,  in the last 168 hours No results found for this basename: LIPASE, AMYLASE,  in the last 168 hours No results found for this basename: AMMONIA,  in the last 168 hours CBC:  Recent Labs Lab 01/29/14 2022  WBC 5.6  HGB 13.0  HCT 37.0  MCV 88.5  PLT 295   Cardiac Enzymes: No results found for this basename: CKTOTAL, CKMB, CKMBINDEX, TROPONINI,  in the last 168 hours BNP (last 3 results) No results found for this basename: PROBNP,  in the last 8760 hours CBG: No results found for this basename: GLUCAP,  in the last 168 hours  No results found for this or any previous visit (from the past 240 hour(s)).   Studies: Dg Chest 2 View  01/29/2014   CLINICAL DATA:  Shortness of breath, cough  EXAM: CHEST  2 VIEW  COMPARISON:  None.  FINDINGS: The heart size and mediastinal contours are within normal limits. There is no focal infiltrate, pulmonary edema, or pleural effusion. Nodular densities project in the right lung base consistent with rib end. The visualized skeletal structures are unremarkable.  IMPRESSION: No active cardiopulmonary disease.   Electronically Signed   By: Seward Meth  Augustin Coupe M.D.   On: 01/29/2014 20:42   Mr Jodene Nam Head Wo Contrast  01/30/2014   CLINICAL DATA:  53 year old female with recent episodes left arm numbness and weakness. Initial encounter.  EXAM: MRI HEAD WITHOUT AND WITH CONTRAST  MRA HEAD WITHOUT CONTRAST  TECHNIQUE: Multiplanar, multiecho pulse sequences of the brain and surrounding structures were obtained without and with intravenous contrast. Angiographic images of the head were obtained using MRA  technique without contrast.  CONTRAST:  15 mL MultiHance.  COMPARISON:  None.  FINDINGS: MRI HEAD FINDINGS  Cerebral volume is normal. No restricted diffusion to suggest acute infarction. No midline shift, mass effect, evidence of mass lesion, ventriculomegaly, extra-axial collection or acute intracranial hemorrhage. Cervicomedullary junction and pituitary are within normal limits. Negative visualized cervical spine. Major intracranial vascular flow voids are preserved.  Pearline Cables and white matter signal is within normal limits for age throughout the brain. No abnormal enhancement identified.  Normal bone marrow signal. Visualized scalp soft tissues are within normal limits. Visible internal auditory structures appear normal. Artifact affecting the orbits, grossly normal orbits soft tissues. Mastoids are clear. Mild paranasal sinus mucosal thickening, mostly right maxillary.  MRA HEAD FINDINGS  Antegrade flow in the posterior circulation with mildly dominant distal left vertebral artery. Normal left PICA origin. Normal vertebrobasilar junction. Dominant right AICA. No basilar stenosis. Normal SCA and PCA origins. Posterior communicating arteries are diminutive or absent. Bilateral PCA branches are normal.  Antegrade flow in both ICA siphons. Normal ophthalmic artery origins. No ICA stenosis. Normal carotid termini, MCA and ACA origins. Diminutive anterior communicating artery. Normal visualized bilateral MCA and ACA branches.  IMPRESSION: 1.  Normal MRI appearance of the brain. 2.  Negative intracranial MRA. 3. Mild right maxillary sinus mucosal thickening.   Electronically Signed   By: Lars Pinks M.D.   On: 01/30/2014 08:47   Mr Jeri Cos YH Contrast  01/30/2014   CLINICAL DATA:  53 year old female with recent episodes left arm numbness and weakness. Initial encounter.  EXAM: MRI HEAD WITHOUT AND WITH CONTRAST  MRA HEAD WITHOUT CONTRAST  TECHNIQUE: Multiplanar, multiecho pulse sequences of the brain and surrounding  structures were obtained without and with intravenous contrast. Angiographic images of the head were obtained using MRA technique without contrast.  CONTRAST:  15 mL MultiHance.  COMPARISON:  None.  FINDINGS: MRI HEAD FINDINGS  Cerebral volume is normal. No restricted diffusion to suggest acute infarction. No midline shift, mass effect, evidence of mass lesion, ventriculomegaly, extra-axial collection or acute intracranial hemorrhage. Cervicomedullary junction and pituitary are within normal limits. Negative visualized cervical spine. Major intracranial vascular flow voids are preserved.  Pearline Cables and white matter signal is within normal limits for age throughout the brain. No abnormal enhancement identified.  Normal bone marrow signal. Visualized scalp soft tissues are within normal limits. Visible internal auditory structures appear normal. Artifact affecting the orbits, grossly normal orbits soft tissues. Mastoids are clear. Mild paranasal sinus mucosal thickening, mostly right maxillary.  MRA HEAD FINDINGS  Antegrade flow in the posterior circulation with mildly dominant distal left vertebral artery. Normal left PICA origin. Normal vertebrobasilar junction. Dominant right AICA. No basilar stenosis. Normal SCA and PCA origins. Posterior communicating arteries are diminutive or absent. Bilateral PCA branches are normal.  Antegrade flow in both ICA siphons. Normal ophthalmic artery origins. No ICA stenosis. Normal carotid termini, MCA and ACA origins. Diminutive anterior communicating artery. Normal visualized bilateral MCA and ACA branches.  IMPRESSION: 1.  Normal MRI appearance of the brain. 2.  Negative  intracranial MRA. 3. Mild right maxillary sinus mucosal thickening.   Electronically Signed   By: Lars Pinks M.D.   On: 01/30/2014 08:47    Scheduled Meds: . aspirin EC  81 mg Oral Daily  . sodium chloride  3 mL Intravenous Q12H  . sodium chloride  3 mL Intravenous Q12H   Continuous Infusions:   Principal  Problem:   Left arm numbness Active Problems:   HYPERLIPIDEMIA   Unspecified vitamin D deficiency    Time spent: 69 mins    [facility] MD [facility] Pager [phone]. If 7PM-7AM, please contact night-coverage at [email or web address], password [facility] 01/30/2014, 10:00 AM  LOS: 1 day

## 2014-01-30 NOTE — Consult Note (Signed)
NEURO HOSPITALIST CONSULT NOTE    Reason for Consult: new onset transient left arm numbness-heaviness  HPI:                                                                                                                                          Alicia Hensley is an 53 y.o. female, right handed, with a past medical history significant for hyperlipidemia, diverticulosis, admitted to [facility] due to new onset transient left arm numbness-heaviness. She said that yesterday she developed sudden onset of left arm numbness and " a feeling that the left arm was limp, heavy and not normal". That episode lasted for approximately 8 hours and completely resolved. Denies associated HA, neck pain, vertigo, double vision, difficulty swallowing, unsteadiness, slurred speech, language or vision impairment. Alicia Hensley said that last Sunday she had a transient episode of numbness of her left thumb. LKW: 3pm 01/29/14 Thrombolysis was not given due to resolution of symptoms, late presentation. No neuro-imaging available.   Past Medical History  Diagnosis Date  . Basal cell cancer     back & RUE; Dr Tonia Brooms  . Hyperlipidemia   . Diverticulosis   . Colon polyps     Past Surgical History  Procedure Laterality Date  . Throat cyst      resected  . G 2 p 2    . Septal deviation repair    . Wisdom tooth extraction    . Colonoscopy with polypectomy  2012    X 1; diverticulosis; Dr Collene Mares    Family History  Problem Relation Age of Onset  . Hypertension Mother   . Colon polyps Mother     pre cancerous polyps  . Thyroid nodules Sister     benign  . Colon cancer      3 maternal great aunts  . Diabetes Neg Hx   . Stroke Neg Hx   . Heart attack Neg Hx     Social History:  reports that she quit smoking about 12 years ago. She does not have any smokeless tobacco history on file. She reports that she drinks about 4.2 ounces of alcohol per week. She reports that she does not use illicit  drugs.  Allergies  Allergen Reactions  . Biotin Itching and Dermatitis    MEDICATIONS:  I have reviewed the patient's current medications.   ROS:                                                                                                                                       History obtained from the patient  General ROS: negative for - chills, fatigue, fever, night sweats, weight gain or weight loss Psychological ROS: negative for - behavioral disorder, hallucinations, memory difficulties, mood swings or suicidal ideation Ophthalmic ROS: negative for - blurry vision, double vision, eye pain or loss of vision ENT ROS: negative for - epistaxis, nasal discharge, oral lesions, sore throat, tinnitus or vertigo Allergy and Immunology ROS: negative for - hives or itchy/watery eyes Hematological and Lymphatic ROS: negative for - bleeding problems, bruising or swollen lymph nodes Endocrine ROS: negative for - galactorrhea, hair pattern changes, polydipsia/polyuria or temperature intolerance Respiratory ROS: negative for - cough, hemoptysis, shortness of breath or wheezing Cardiovascular ROS: negative for - chest pain, dyspnea on exertion, edema or irregular heartbeat Gastrointestinal ROS: negative for - abdominal pain, diarrhea, hematemesis, nausea/vomiting or stool incontinence Genito-Urinary ROS: negative for - dysuria, hematuria, incontinence or urinary frequency/urgency Musculoskeletal ROS: negative for - joint swelling Neurological ROS: as noted in HPI Dermatological ROS: negative for rash and skin lesion changes   Physical exam: pleasant female in no apparent distress. Blood pressure 115/78, pulse 67, temperature 97.9 F (36.6 C), temperature source Oral, resp. rate 18, height $RemoveBe'5\' 7"'[id number]$  (1.702 m), weight 67.2 kg (148 lb 2.4 oz), SpO2 100.00%. Head: normocephalic. Neck:  supple, no bruits, no JVD. Cardiac: no murmurs. Lungs: clear. Abdomen: soft, no tender, no mass. Extremities: no edema.  Neurologic Examination:                                                                                                       Mental Status: Alert, oriented, thought content appropriate.  Speech fluent without evidence of aphasia.  Able to follow 3 step commands without difficulty. Cranial Nerves: II: Discs flat bilaterally; Visual fields grossly normal, pupils equal, round, reactive to light and accommodation III,IV, VI: ptosis not present, extra-ocular motions intact bilaterally V,VII: smile symmetric, facial light touch sensation normal bilaterally VIII: hearing normal bilaterally IX,X: gag reflex present XI: bilateral shoulder shrug XII: midline tongue extension without atrophy or fasciculations  Motor: Right : Upper extremity   5/5    Left:     Upper extremity   5/5  Lower extremity   5/5     Lower  extremity   5/5 Tone and bulk:normal tone throughout; no atrophy noted Sensory: Pinprick and light touch intact throughout, bilaterally Deep Tendon Reflexes:  Right: Upper Extremity   Left: Upper extremity   biceps (C-5 to C-6) 2/4   biceps (C-5 to C-6) 2/4 tricep (C7) 2/4    triceps (C7) 2/4 Brachioradialis (C6) 2/4  Brachioradialis (C6) 2/4  Lower Extremity Lower Extremity  quadriceps (L-2 to L-4) 2/4   quadriceps (L-2 to L-4) 2/4 Achilles (S1) 2/4   Achilles (S1) 2/4  Plantars: Right: downgoing   Left: downgoing Cerebellar: normal finger-to-nose,  normal heel-to-shin test Gait: No tested. CV: pulses palpable throughout    Lab Results  Component Value Date/Time   CHOL 247* 07/16/2013  2:45 PM    Results for orders placed during the hospital encounter of 01/29/14 (from the past 48 hour(s))  CBC     Status: None   Collection Time    01/29/14  8:22 PM      Result Value Ref Range   WBC 5.6  4.0 - 10.5 K/uL   RBC 4.18  3.87 - 5.11 MIL/uL    Hemoglobin 13.0  12.0 - 15.0 g/dL   HCT 37.0  36.0 - 46.0 %   MCV 88.5  78.0 - 100.0 fL   MCH 31.1  26.0 - 34.0 pg   MCHC 35.1  30.0 - 36.0 g/dL   RDW 12.5  11.5 - 15.5 %   Platelets 295  150 - 400 K/uL  BASIC METABOLIC PANEL     Status: Abnormal   Collection Time    01/29/14  8:22 PM      Result Value Ref Range   Sodium 140  137 - 147 mEq/L   Potassium 4.1  3.7 - 5.3 mEq/L   Chloride 99  96 - 112 mEq/L   CO2 26  19 - 32 mEq/L   Glucose, Bld 98  70 - 99 mg/dL   BUN 12  6 - 23 mg/dL   Creatinine, Ser 0.80  0.50 - 1.10 mg/dL   Calcium 9.2  8.4 - 10.5 mg/dL   GFR calc non Af Amer 83 (*) >90 mL/min   GFR calc Af Amer >90  >90 mL/min   Comment: (NOTE)     The eGFR has been calculated using the CKD EPI equation.     This calculation has not been validated in all clinical situations.     eGFR's persistently <90 mL/min signify possible Chronic Kidney     Disease.  POCT I-STAT TROPONIN I     Status: None   Collection Time    01/29/14  8:40 PM      Result Value Ref Range   Troponin i, poc 0.01  0.00 - 0.08 ng/mL   Comment 3            Comment: Due to the release kinetics of cTnI,     a negative result within the first hours     of the onset of symptoms does not rule out     myocardial infarction with certainty.     If myocardial infarction is still suspected,     repeat the test at appropriate intervals.    Dg Chest 2 View  01/29/2014   CLINICAL DATA:  Shortness of breath, cough  EXAM: CHEST  2 VIEW  COMPARISON:  None.  FINDINGS: The heart size and mediastinal contours are within normal limits. There is no focal infiltrate, pulmonary edema, or pleural effusion. Nodular densities project in  the right lung base consistent with rib end. The visualized skeletal structures are unremarkable.  IMPRESSION: No active cardiopulmonary disease.   Electronically Signed   By: Abelardo Diesel M.D.   On: 01/29/2014 20:42    Assessment/Plan: 53 y/o with hyperlipidemia and left arm numbness associated  with a " limp" feeling that lasted for approximately 8 hours and resolved. Had left thumb numbness last Sunday. No neck symptoms. Neuro-exam non focal. TIA?Marland Kitchen Exam is not consistent with a cervical radiculopathy. First presentation of a demyelinating disorder seems less likely. No neuro-imaging available at this moment and thus will suggest MRI brain with and without contrast and then make further decisions accordingly.  Dorian Pod, MD 01/30/2014, 5:54 AM

## 2014-01-30 NOTE — H&P (Signed)
PCP:   Unice Cobble, MD   Chief Complaint:  Left arm numbness  HPI: 53 yo female with left arm numbness and weakness onset about 3pm that lasted til about 10p then resolved.  She said on Sunday (3 days ago) her left thumb was numb also then went away.  Today the numbness with the whole left arm.  No neck pain.  No shoulder pain.  She thought it may be associated from carrying her heavy purse on that shoulder.  No fevers.  No facial drooping, no facial numbness.  No slurred speech.  No weakness in other extermeties.  No headache.  Does not take asa daily.  No neurological symptoms at this time.  Review of Systems:  Positive and negative as per HPI otherwise all other systems are negative  Past Medical History: Past Medical History  Diagnosis Date  . Basal cell cancer     back & RUE; Dr Tonia Brooms  . Hyperlipidemia   . Diverticulosis   . Colon polyps    Past Surgical History  Procedure Laterality Date  . Throat cyst      resected  . G 2 p 2    . Septal deviation repair    . Wisdom tooth extraction    . Colonoscopy with polypectomy  2012    X 1; diverticulosis; Dr Collene Mares    Medications: Prior to Admission medications   Medication Sig Start Date End Date Taking? Authorizing Provider  Ascorbic Acid (VITAMIN C) 1000 MG tablet Take 1,000 mg by mouth 5 (five) times daily.   Yes Historical Provider, MD  CALCIUM PO Take 2 tablets by mouth at bedtime.   Yes Historical Provider, MD  Multiple Vitamin (MULTIVITAMIN WITH MINERALS) TABS tablet Take 1 tablet by mouth at bedtime.   Yes Historical Provider, MD  Tetrahydrozoline HCl (VISINE OP) Place 1 drop into both eyes daily as needed (dry eyes/ redness).   Yes Historical Provider, MD  valACYclovir (VALTREX) 500 MG tablet Take 1 tablet by mouth See admin instructions. Take 2 tablets (1000 mg) at onset, then take 1 tablet (500 mg) twice daily for about 3 days 11/19/13  Yes Historical Provider, MD    Allergies:   Allergies  Allergen Reactions   . Biotin Itching and Dermatitis    Social History:  reports that she quit smoking about 12 years ago. She does not have any smokeless tobacco history on file. She reports that she drinks about 4.2 ounces of alcohol per week. She reports that she does not use illicit drugs.  Family History: Family History  Problem Relation Age of Onset  . Hypertension Mother   . Colon polyps Mother     pre cancerous polyps  . Thyroid nodules Sister     benign  . Colon cancer      3 maternal great aunts  . Diabetes Neg Hx   . Stroke Neg Hx   . Heart attack Neg Hx     Physical Exam: Filed Vitals:   01/29/14 2014 01/29/14 2240 01/30/14 0001  BP: 140/90 145/85 135/76  Pulse: 88 88 82  Temp: 97.6 F (36.4 C)  98.1 F (36.7 C)  TempSrc: Oral  Oral  Resp: 16 20 15   Height: 5\' 6"  (1.676 m)    Weight: 64.864 kg (143 lb)    SpO2: 98% 100% 98%   General appearance: alert, cooperative and no distress Head: Normocephalic, without obvious abnormality, atraumatic Nose: Nares normal. Septum midline. Mucosa normal. No drainage or sinus tenderness. Neck: no  JVD and supple, symmetrical, trachea midline Lungs: clear to auscultation bilaterally Heart: regular rate and rhythm, S1, S2 normal, no murmur, click, rub or gallop Abdomen: soft, non-tender; bowel sounds normal; no masses,  no organomegaly Extremities: extremities normal, atraumatic, no cyanosis or edema Pulses: 2+ and symmetric Skin: Skin color, texture, turgor normal. No rashes or lesions Neurologic: Grossly normal    Labs on Admission:   Recent Labs  01/29/14 2022  NA 140  K 4.1  CL 99  CO2 26  GLUCOSE 98  BUN 12  CREATININE 0.80  CALCIUM 9.2    Recent Labs  01/29/14 2022  WBC 5.6  HGB 13.0  HCT 37.0  MCV 88.5  PLT 295   Radiological Exams on Admission: Dg Chest 2 View  01/29/2014   CLINICAL DATA:  Shortness of breath, cough  EXAM: CHEST  2 VIEW  COMPARISON:  None.  FINDINGS: The heart size and mediastinal contours are  within normal limits. There is no focal infiltrate, pulmonary edema, or pleural effusion. Nodular densities project in the right lung base consistent with rib end. The visualized skeletal structures are unremarkable.  IMPRESSION: No active cardiopulmonary disease.   Electronically Signed   By: Abelardo Diesel M.D.   On: 01/29/2014 20:42    Assessment/Plan  53 yo female with left arm numbness now resolved  Principal Problem:   Left arm numbness-  Neurology consulted.  Recommended mri in am, no ct head.  Monitor on tele.  Mri in am.  Further w/u per neuro team.  freq neuro cks overnight.  ekg nsr.  Active Problems:   HYPERLIPIDEMIA   Unspecified vitamin D deficiency    Analleli Gierke A 01/30/2014, 1:26 AM

## 2014-01-30 NOTE — ED Notes (Signed)
Pt ambulated to restroom with slow, steady gait. 

## 2014-01-30 NOTE — Progress Notes (Signed)
Called ED RN to get report but RN was unavailable.

## 2014-01-30 NOTE — ED Notes (Signed)
Report attempted x 1

## 2014-01-30 NOTE — Progress Notes (Signed)
Pt has returned from MRI with telemetry off but she refused to have it put back on her stating "Girl you don't need to put it back on trust me am fine; I don't need it".

## 2014-01-30 NOTE — Discharge Summary (Addendum)
Physician Discharge Summary  CORISSA PUCCINI [id number] DOB: July 29, 1961 DOA: 01/29/2014  PCP: Unice Cobble, MD  Admit date: 01/29/2014 Discharge date: 01/30/2014  Time spent: 65 minutes  Recommendations for Outpatient Follow-up:  1. Followup with PCP one week post discharge. On followup patient need to be reassessed on the left upper extremity weakness and portable TIA. Patient's lipid panel needs to be reassessed per PCP to determine whether patient needs to be placed on a statin as patient was resistant to being started on a statin during the hospitalization and wanted to followup with her PCP.  Discharge Diagnoses:  Principal Problem:   TIA (transient ischemic attack): Probable Active Problems:   HYPERLIPIDEMIA   Unspecified vitamin D deficiency   Left arm numbness   Discharge Condition: Stable and improved  Diet recommendation: Heart healthy  Filed Weights   01/29/14 2014 01/30/14 0325  Weight: 64.864 kg (143 lb) 67.2 kg (148 lb 2.4 oz)    History of present illness:  MARINELL BIR is an 53 y.o. female, right handed, with a past medical history significant for hyperlipidemia, diverticulosis, admitted to [facility] due to new onset transient left arm numbness-heaviness.  She said that yesterday she developed sudden onset of left arm numbness and " a feeling that the left arm was limp, heavy and not normal". That episode lasted for approximately 8 hours and completely resolved. Denies associated HA, neck pain, vertigo, double vision, difficulty swallowing, unsteadiness, slurred speech, language or vision impairment.  Mrs. Fayson said that last Sunday she had a transient episode of numbness of her left thumb.  LKW: 3pm 01/29/14  Thrombolysis was not given due to resolution of symptoms, late presentation.  No neuro-imaging available.   Hospital Course:  #1 left upper extremity numbness/probable TIA Patient had presented with sudden onset of left upper extremity numbness and a feeling  that her left thumb went limp heavy and was not acting normal. Patient had had similar episode of transient numbness to the left common 5 days prior to admission that resolved on its own. Patient presentation with left upper extremity numbness lasted approximately 8 hours and completely resolved patient presented to the ED for further evaluation. Patient was deemed not a TPA candidate due to resolution of symptoms and late presentation. Head CT was ordered however patient refused. MRI MRA of the head was ordered which was negative for any acute infarction. Patient was placed on aspirin for secondary stroke prevention and underwent a stroke workup. Carotid Dopplers which were done had no significant ICA stenosis. 2-D echo had a normal EF with no wall motion abnormalities no PFO and no source of emboli. Patient was seen by speech therapy PT and OT. Fasting lipid panel which was obtained came back with LDL of 133. Patient was resistant to being started on a statin as she stated that a lipoprotein panel was done per her PCP and stated that she did not have hyperlipidemia. Patient is insistent on being followed up by PCP for problem hyperlipidemia. Patient was insistent on being discharged. Patient stroke workup was done patient's symptoms had resolved and patient will be discharged home on aspirin 81 mg daily for secondary stroke prevention.  #2 probable hyperlipidemia Patient was noted to have a history of hyperlipidemia. Fasting lipid panel which was done during this hospitalization and a cholesterol of 237 LDL of 133. Patient stated that she had similar results in the past that her PCPs office and I lipoprotein panel was done and she was told she did not  have any hyperlipidemia. Patient is resistant to be placed on a statin at this time overlie to followup with PCP as outpatient concerning this.  The rest of patient's chronic medical issues remained stable throughout the hospitalization patient be discharged in  stable and improved condition.  Procedures:  MRI/MRA head 01/30/2014  2-D echo 01/30/2049  Carotid Dopplers 01/30/2014  Consultations:  Neurology: Dr. Aram Beecham 01/30/2014  Discharge Exam: Filed Vitals:   01/30/14 1356  BP: 112/62  Pulse: 62  Temp: 98.2 F (36.8 C)  Resp: 20    General: NAD Cardiovascular: RRR Respiratory: CTAB  Discharge Instructions      Discharge Orders   Future Appointments Provider Department Dept Phone   02/23/2014 11:40 AM [facility] [employer] [facility]  IMAGING [phone]   Please wear two piece clothing and wear no powder or deodorant. Please arrive 15 minutes early prior to your appointment time.   Future Orders Complete By Expires   Diet - low sodium heart healthy  As directed    Discharge instructions  As directed    Comments:     fOLLOW UP WITH Unice Cobble, MD IN 1 WEEK.   Increase activity slowly  As directed        Medication List         aspirin 81 MG EC tablet  Take 1 tablet (81 mg total) by mouth daily.     CALCIUM PO  Take 2 tablets by mouth at bedtime.     multivitamin with minerals Tabs tablet  Take 1 tablet by mouth at bedtime.     simvastatin 20 MG tablet  Commonly known as:  ZOCOR  Take 1 tablet (20 mg total) by mouth daily.     valACYclovir 500 MG tablet  Commonly known as:  VALTREX  Take 1 tablet by mouth See admin instructions. Take 2 tablets (1000 mg) at onset, then take 1 tablet (500 mg) twice daily for about 3 days     VISINE OP  Place 1 drop into both eyes daily as needed (dry eyes/ redness).     vitamin C 1000 MG tablet  Take 1,000 mg by mouth 5 (five) times daily.       Allergies  Allergen Reactions  . Biotin Itching and Dermatitis   Follow-up Information   Follow up with Unice Cobble, MD. Schedule an appointment as soon as possible for a visit in 1 week.   Specialty:  Internal Medicine   Contact information:   [address] [address] [phone]         The results of significant diagnostics from this hospitalization (including imaging, microbiology, ancillary and laboratory) are listed below for reference.    Significant Diagnostic Studies: Dg Chest 2 View  01/29/2014   CLINICAL DATA:  Shortness of breath, cough  EXAM: CHEST  2 VIEW  COMPARISON:  None.  FINDINGS: The heart size and mediastinal contours are within normal limits. There is no focal infiltrate, pulmonary edema, or pleural effusion. Nodular densities project in the right lung base consistent with rib end. The visualized skeletal structures are unremarkable.  IMPRESSION: No active cardiopulmonary disease.   Electronically Signed   By: Abelardo Diesel M.D.   On: 01/29/2014 20:42   Mr Jodene Nam Head Wo Contrast  01/30/2014   CLINICAL DATA:  54 year old female with recent episodes left arm numbness and weakness. Initial encounter.  EXAM: MRI HEAD WITHOUT AND WITH CONTRAST  MRA HEAD WITHOUT CONTRAST  TECHNIQUE: Multiplanar, multiecho pulse sequences of  the brain and surrounding structures were obtained without and with intravenous contrast. Angiographic images of the head were obtained using MRA technique without contrast.  CONTRAST:  15 mL MultiHance.  COMPARISON:  None.  FINDINGS: MRI HEAD FINDINGS  Cerebral volume is normal. No restricted diffusion to suggest acute infarction. No midline shift, mass effect, evidence of mass lesion, ventriculomegaly, extra-axial collection or acute intracranial hemorrhage. Cervicomedullary junction and pituitary are within normal limits. Negative visualized cervical spine. Major intracranial vascular flow voids are preserved.  Pearline Cables and white matter signal is within normal limits for age throughout the brain. No abnormal enhancement identified.  Normal bone marrow signal. Visualized scalp soft tissues are within normal limits. Visible internal auditory structures appear normal. Artifact affecting the orbits, grossly normal orbits soft tissues. Mastoids are clear.  Mild paranasal sinus mucosal thickening, mostly right maxillary.  MRA HEAD FINDINGS  Antegrade flow in the posterior circulation with mildly dominant distal left vertebral artery. Normal left PICA origin. Normal vertebrobasilar junction. Dominant right AICA. No basilar stenosis. Normal SCA and PCA origins. Posterior communicating arteries are diminutive or absent. Bilateral PCA branches are normal.  Antegrade flow in both ICA siphons. Normal ophthalmic artery origins. No ICA stenosis. Normal carotid termini, MCA and ACA origins. Diminutive anterior communicating artery. Normal visualized bilateral MCA and ACA branches.  IMPRESSION: 1.  Normal MRI appearance of the brain. 2.  Negative intracranial MRA. 3. Mild right maxillary sinus mucosal thickening.   Electronically Signed   By: Lars Pinks M.D.   On: 01/30/2014 08:47   Mr Jeri Cos FY Contrast  01/30/2014   CLINICAL DATA:  53 year old female with recent episodes left arm numbness and weakness. Initial encounter.  EXAM: MRI HEAD WITHOUT AND WITH CONTRAST  MRA HEAD WITHOUT CONTRAST  TECHNIQUE: Multiplanar, multiecho pulse sequences of the brain and surrounding structures were obtained without and with intravenous contrast. Angiographic images of the head were obtained using MRA technique without contrast.  CONTRAST:  15 mL MultiHance.  COMPARISON:  None.  FINDINGS: MRI HEAD FINDINGS  Cerebral volume is normal. No restricted diffusion to suggest acute infarction. No midline shift, mass effect, evidence of mass lesion, ventriculomegaly, extra-axial collection or acute intracranial hemorrhage. Cervicomedullary junction and pituitary are within normal limits. Negative visualized cervical spine. Major intracranial vascular flow voids are preserved.  Pearline Cables and white matter signal is within normal limits for age throughout the brain. No abnormal enhancement identified.  Normal bone marrow signal. Visualized scalp soft tissues are within normal limits. Visible internal  auditory structures appear normal. Artifact affecting the orbits, grossly normal orbits soft tissues. Mastoids are clear. Mild paranasal sinus mucosal thickening, mostly right maxillary.  MRA HEAD FINDINGS  Antegrade flow in the posterior circulation with mildly dominant distal left vertebral artery. Normal left PICA origin. Normal vertebrobasilar junction. Dominant right AICA. No basilar stenosis. Normal SCA and PCA origins. Posterior communicating arteries are diminutive or absent. Bilateral PCA branches are normal.  Antegrade flow in both ICA siphons. Normal ophthalmic artery origins. No ICA stenosis. Normal carotid termini, MCA and ACA origins. Diminutive anterior communicating artery. Normal visualized bilateral MCA and ACA branches.  IMPRESSION: 1.  Normal MRI appearance of the brain. 2.  Negative intracranial MRA. 3. Mild right maxillary sinus mucosal thickening.   Electronically Signed   By: Lars Pinks M.D.   On: 01/30/2014 08:47    Microbiology: No results found for this or any previous visit (from the past 240 hour(s)).   Labs: Basic Metabolic Panel:  Recent Labs Lab  01/29/14 2022  NA 140  K 4.1  CL 99  CO2 26  GLUCOSE 98  BUN 12  CREATININE 0.80  CALCIUM 9.2   Liver Function Tests: No results found for this basename: AST, ALT, ALKPHOS, BILITOT, PROT, ALBUMIN,  in the last 168 hours No results found for this basename: LIPASE, AMYLASE,  in the last 168 hours No results found for this basename: AMMONIA,  in the last 168 hours CBC:  Recent Labs Lab 01/29/14 2022  WBC 5.6  HGB 13.0  HCT 37.0  MCV 88.5  PLT 295   Cardiac Enzymes: No results found for this basename: CKTOTAL, CKMB, CKMBINDEX, TROPONINI,  in the last 168 hours BNP: BNP (last 3 results) No results found for this basename: PROBNP,  in the last 8760 hours CBG: No results found for this basename: GLUCAP,  in the last 168 hours     Signed:  [facility] MD [facility] 01/30/2014, 5:19  PM   Addendum: 01/30/2014 1715 hours Went by and spoke to patient prior to discharge about fasting lipid panel with LDL of 133 and due to concern for possible TIA had recommended starting statin therapy, with goal LDL < 70. Patient is currently in agreement with starting a statin and following up with PCP as outpatient for further management. Will start patient on Zocor 20 mg daily. Patient will likely need a comprehensive metabolic profile done in about 4-6 weeks to followup on LFTs as well as a lipid panel. Patient will followup with PCP as outpatient.

## 2014-02-04 ENCOUNTER — Encounter: Payer: Self-pay | Admitting: Internal Medicine

## 2014-02-04 ENCOUNTER — Ambulatory Visit (INDEPENDENT_AMBULATORY_CARE_PROVIDER_SITE_OTHER): Payer: 59 | Admitting: Internal Medicine

## 2014-02-04 ENCOUNTER — Ambulatory Visit (INDEPENDENT_AMBULATORY_CARE_PROVIDER_SITE_OTHER)
Admission: RE | Admit: 2014-02-04 | Discharge: 2014-02-04 | Disposition: A | Discharge status: Home / Self Care | Payer: 59 | Source: Ambulatory Visit | Attending: Internal Medicine | Admitting: Internal Medicine

## 2014-02-04 VITALS — BP 120/80 | HR 75 | Temp 98.2°F | Wt 145.8 lb

## 2014-02-04 DIAGNOSIS — E785 Hyperlipidemia, unspecified: Secondary | ICD-10-CM

## 2014-02-04 DIAGNOSIS — R209 Unspecified disturbances of skin sensation: Secondary | ICD-10-CM

## 2014-02-04 DIAGNOSIS — R200 Anesthesia of skin: Secondary | ICD-10-CM

## 2014-02-04 NOTE — Assessment & Plan Note (Signed)
She is to continue the statin and low-dose aspirin until evaluation is completed

## 2014-02-04 NOTE — Patient Instructions (Signed)
   Please convert to the backpack-type purse as we discussed. Cervical spine films will be performed. Nerve conduction/EMG may be necessary. Also I may have the sports medicine specialist evaluate you depending on findings.

## 2014-02-04 NOTE — Assessment & Plan Note (Addendum)
She'll be asked to wear a knapsack type purse on her back. Cervical spine films will be performed. Consideration could be given to EMG/nerve conduction test if symptoms persist.  I may recommend a Sports medicine vs Neurology consultation to determine optimal therapy.

## 2014-02-04 NOTE — Progress Notes (Signed)
   Subjective:    Patient ID: Alicia Hensley, female    DOB: 08-05-1961, 52 y.o.   MRN: [id number]  HPI S/P hospitalization 01/29/14-01/30/14 after episode of L arm numbness for approximately 8 hours; was seen at [facility] where EKG, MRI, CXR, carotid dopplers were performed.  At that visit, her cholesterol level as elevated and she was told to begin Lipitor and low-dose ASA regimen. She has also initiated a heart healthy diet.  Suggested that she follow up with her PCP for hyperlipidemia work-up due to possible TIA. Of note, she experienced no prodromal cardiac or neurological symptoms noted on 01/29/14; her only symptom was the left arm numbness, at lateral forearm and LUE, described as pulsation.Today she describes the same sensation; reports it has been intermittent since first occurrence. Notes her L thumb was numb for less than one hour on Sunday 2/8, preceding the arm numbness.   Voices some concern over large handbag she carries on L arm, weighing over 20 lbs at time.  Review of Systems She specifically denies HA, changes in vision, dysphonia, dizziness, or other lateralizing symptoms.     Objective:   Physical Exam General appearance:good health ;well nourished; no acute distress or increased work of breathing is present.  No  lymphadenopathy about the head, neck, or axilla noted.  Neuro: mini mental WNL; neck/ arm ROM WNL; C6/C7/C8 grip test normal bilaterally.  Eyes: No conjunctival inflammation or lid edema is present. There is no scleral icterus. EOM intact bilaterally.  Neck:  No deformities, thyromegaly, masses, or tenderness noted.   Supple with full range of motion without pain.  Heart:  Normal rate and regular rhythm. S1 and S2 normal without gallop, murmur, click, rub or other extra sounds.  Lungs: No increased work of breathing.   Extremities:  No cyanosis, edema, or clubbing noted. Grip strength equal bilaterally.  Skin: Warm & dry w/o jaundice or tenting.        Assessment &  Plan:

## 2014-02-04 NOTE — Progress Notes (Signed)
   Subjective:    Patient ID: Alicia Hensley, female    DOB: 1961/06/22, 53 y.o.   MRN: [id number]  HPI Initial symptoms were numbness of L thumb 2/8 lasting 1 hour. She was hospitalized with paresthesias of the left upper extremity which she described as "pulsations" which lasted approximately 8 hours on 01/29/14. Extensive evaluation including  MRI and carotid Doppler were negative or normal  Her LDL was 133; HDL 90; triglycerides 71. Atorvastatin and low dose aspirin were recommended which were initiated.  She questions whether the recurrent intermittent symptoms she's had since hospitalization were related to carrying a 20 pound bag on the left shoulder.    Review of Systems As of discharge 2/13 a heart healthy diet is followed.  Family history is negative for premature coronary disease. Advanced cholesterol testing reveals  LDL goal is less than 160 ; ideally < 130 . Specifically denied are  chest pain, palpitations, dyspnea, or claudication.  Significant abdominal symptoms or myalgias not present.     Objective:   Physical Exam  Gen.: Healthy and well-nourished in appearance. Alert, appropriate and cooperative throughout exam. Appears younger than stated age  Head: Normocephalic without obvious abnormalities  Eyes: No corneal or conjunctival inflammation noted. Pupils equal round reactive to light and accommodation.   Neck: No deformities, masses, or tenderness noted. Range of motion & Thyroid normal. Lungs: Normal respiratory effort; chest expands symmetrically. Lungs are clear to auscultation without rales, wheezes, or increased work of breathing. Heart: Normal rate and rhythm. Normal S1 and S2. No gallop, click, or rub. S4  w/o murmur.                         Musculoskeletal/extremities: No deformity or scoliosis noted of  the spine. No clubbing, cyanosis, edema, or significant extremity  deformity noted. Range of motion normal .Tone & strength normal. Hand joints  normal Fingernail  health good. Vascular: Carotid, radial artery pulses are full and equal. No bruits present. Neurologic: Alert and oriented x3. Deep tendon reflexes symmetrical and normal.  Gait normal  . Rhomberg & finger to nose       Skin: Intact without suspicious lesions or rashes. Lymph: No cervical, axillary  lymphadenopathy present. Psych: Mood and affect are normal. Normally interactive                                                                                        Assessment & Plan:  See Current Assessment & Plan in Problem List under specific Diagnosis. R/O cervical radiculopathy

## 2014-02-04 NOTE — Progress Notes (Signed)
Pre visit review using our clinic review tool, if applicable. No additional management support is needed unless otherwise documented below in the visit note. 

## 2014-02-05 ENCOUNTER — Telehealth: Payer: Self-pay | Admitting: *Deleted

## 2014-02-05 NOTE — Telephone Encounter (Signed)
She also needs to know if you wish for her to continue her cholesterol meds and to continue her toprol as previously ordered.

## 2014-02-05 NOTE — Telephone Encounter (Signed)
I thought that she received  the results through My Chart.  The spine shows some straightening and slight malalignment. This in addition to carrying heavy purse will narrow space from which the nerve exits. She should change the pocketbook as we discussed. If symptoms persist or progress; physical therapy would be indicated. I have been working with a physical therapist for a similar problem related to using the computer.

## 2014-02-05 NOTE — Telephone Encounter (Signed)
Patient phoned requesting neck imaging results.    CB# [phone]

## 2014-02-05 NOTE — Telephone Encounter (Signed)
Phoned and notified of MD's response and instructions.  Patient requests clarification with you to confirm that there was nothing indicative of a stroke on her scans.    CB# [phone]

## 2014-02-19 NOTE — Telephone Encounter (Signed)
   No lesions seen on MRI. Unless she is having recurrent numbness or weakness in the extremity; the statin can be discontinued.  Monitor her blood pressure off the Toprol;  goal is an average less than 135/85.

## 2014-02-20 NOTE — Telephone Encounter (Signed)
Consulted PCP to clarify and phoned patient with MD instructions.  Patient verbalized understanding.

## 2014-02-20 NOTE — Telephone Encounter (Signed)
Just to clarify, if she is NOT having continued/recurring extremity weakness/numbness, stay on statin? Please advise.  On phone with patient now.

## 2014-02-23 ENCOUNTER — Ambulatory Visit
Admission: RE | Admit: 2014-02-23 | Discharge: 2014-02-23 | Disposition: A | Discharge status: Home / Self Care | Payer: 59 | Source: Ambulatory Visit

## 2014-02-23 DIAGNOSIS — Z1231 Encounter for screening mammogram for malignant neoplasm of breast: Secondary | ICD-10-CM

## 2014-09-09 ENCOUNTER — Ambulatory Visit (INDEPENDENT_AMBULATORY_CARE_PROVIDER_SITE_OTHER): Payer: 59 | Admitting: Internal Medicine

## 2014-09-09 ENCOUNTER — Encounter: Payer: Self-pay | Admitting: Internal Medicine

## 2014-09-09 VITALS — BP 140/82 | HR 73 | Temp 98.7°F | Resp 12 | Ht 65.5 in | Wt 142.5 lb

## 2014-09-09 DIAGNOSIS — Z85828 Personal history of other malignant neoplasm of skin: Secondary | ICD-10-CM

## 2014-09-09 DIAGNOSIS — M949 Disorder of cartilage, unspecified: Secondary | ICD-10-CM

## 2014-09-09 DIAGNOSIS — Z0000 Encounter for general adult medical examination without abnormal findings: Secondary | ICD-10-CM

## 2014-09-09 DIAGNOSIS — E785 Hyperlipidemia, unspecified: Secondary | ICD-10-CM

## 2014-09-09 DIAGNOSIS — M899 Disorder of bone, unspecified: Secondary | ICD-10-CM

## 2014-09-09 DIAGNOSIS — M8580 Other specified disorders of bone density and structure, unspecified site: Secondary | ICD-10-CM

## 2014-09-09 DIAGNOSIS — Z23 Encounter for immunization: Secondary | ICD-10-CM

## 2014-09-09 DIAGNOSIS — Z87410 Personal history of cervical dysplasia: Secondary | ICD-10-CM

## 2014-09-09 DIAGNOSIS — Z86010 Personal history of colonic polyps: Secondary | ICD-10-CM

## 2014-09-09 NOTE — Progress Notes (Signed)
Pre visit review using our clinic review tool, if applicable. No additional management support is needed unless otherwise documented below in the visit note. 

## 2014-09-09 NOTE — Patient Instructions (Signed)
Your next office appointment will be determined based upon review of your pending labs . Those instructions will be transmitted to you through My Chart  OR  by mail;whichever process is your choice to receive results & recommendations .   L. lysine, a natural supplement, may be effective for suppressing the oral lesions

## 2014-09-09 NOTE — Progress Notes (Signed)
   Subjective:    Patient ID: Alicia Hensley, female    DOB: [birth year], 53 y.o.   MRN: [id number]  HPI  She  is here for a physical;acute issues  include intermittent aphthous type ulcers along the lateral aspect of her tongue in the last five weeks.    Review of Systems  She's also had dry mouth without associated dry eyes or dysphagia.  At one time she noted metallic taste drinking water but this has resolved.  She has used vitamins B. and zinc supplements without benefit  Per her Dentist  she changed toothpaste but this did not help.  Previously she took Biotin but she had itching & dermatitis with this     Objective:   Physical Exam  Positive or pertinent findings: Creasing of ear lobes; R > L. Tiny ulcer L lateral tongue. S4 Aorta palpable ; no AAA  Gen.: Healthy and well-nourished in appearance. Alert, appropriate and cooperative throughout exam. Appears younger than stated age  Head: Normocephalic without obvious abnormalities  Eyes: No corneal or conjunctival inflammation noted. Pupils equal round reactive to light and accommodation. Extraocular motion intact.  Ears: External  ear exam reveals no significant lesions or deformities. Canals clear .TMs normal. Hearing is grossly normal bilaterally. Nose: External nasal exam reveals no deformity or inflammation. Nasal mucosa are pink and moist. No lesions or exudates noted.   Mouth: Oral mucosa and oropharynx reveal no lesions or exudates. Teeth in good repair. Neck: No deformities, masses, or tenderness noted. Range of motion & Thyroid normal Lungs: Normal respiratory effort; chest expands symmetrically. Lungs are clear to auscultation without rales, wheezes, or increased work of breathing. Heart: Normal rate and rhythm. Normal S1 and S2. No gallop, click, or rub. No murmur. Abdomen: Bowel sounds normal; abdomen soft and nontender. No masses, organomegaly or hernias noted. Genitalia: as per Gyn                                   Musculoskeletal/extremities: No deformity or scoliosis noted of  the thoracic or lumbar spine.  No clubbing, cyanosis, edema, or significant extremity  deformity noted. Range of motion normal .Tone & strength normal. Hand joints normal Fingernail health good. Able to lie down & sit up w/o help. Negative SLR bilaterally Vascular: Carotid, radial artery, dorsalis pedis and  posterior tibial pulses are full and equal. No bruits present. Neurologic: Alert and oriented x3. Deep tendon reflexes symmetrical and normal.  Gait normal .       Skin: Intact without suspicious lesions or rashes. Lymph: No cervical, axillary lymphadenopathy present. Psych: Mood and affect are normal. Normally interactive                                                                                        Assessment & Plan:  #1 comprehensive physical exam; no acute findings  Plan: see Orders  & Recommendations

## 2014-09-23 ENCOUNTER — Other Ambulatory Visit (INDEPENDENT_AMBULATORY_CARE_PROVIDER_SITE_OTHER): Payer: 59

## 2014-09-23 ENCOUNTER — Ambulatory Visit (INDEPENDENT_AMBULATORY_CARE_PROVIDER_SITE_OTHER)
Admission: RE | Admit: 2014-09-23 | Discharge: 2014-09-23 | Disposition: A | Discharge status: Home / Self Care | Payer: 59 | Source: Ambulatory Visit | Attending: Internal Medicine | Admitting: Internal Medicine

## 2014-09-23 ENCOUNTER — Other Ambulatory Visit: Payer: Self-pay | Admitting: Internal Medicine

## 2014-09-23 DIAGNOSIS — Z0000 Encounter for general adult medical examination without abnormal findings: Secondary | ICD-10-CM

## 2014-09-23 DIAGNOSIS — M8580 Other specified disorders of bone density and structure, unspecified site: Secondary | ICD-10-CM

## 2014-09-23 LAB — HEPATIC FUNCTION PANEL
ALBUMIN: 3.9 g/dL (ref 3.5–5.2)
ALT: 16 U/L (ref 0–35)
AST: 25 U/L (ref 0–37)
Alkaline Phosphatase: 54 U/L (ref 39–117)
Bilirubin, Direct: 0.1 mg/dL (ref 0.0–0.3)
TOTAL PROTEIN: 7.5 g/dL (ref 6.0–8.3)
Total Bilirubin: 0.6 mg/dL (ref 0.2–1.2)

## 2014-09-23 LAB — BASIC METABOLIC PANEL
BUN: 10 mg/dL (ref 6–23)
CALCIUM: 9.2 mg/dL (ref 8.4–10.5)
CO2: 29 mEq/L (ref 19–32)
CREATININE: 0.7 mg/dL (ref 0.4–1.2)
Chloride: 102 mEq/L (ref 96–112)
GFR: 94.23 mL/min (ref >60.00)
Glucose, Bld: 85 mg/dL (ref 70–99)
Potassium: 4.2 mEq/L (ref 3.5–5.1)
SODIUM: 139 meq/L (ref 135–145)

## 2014-09-23 LAB — CBC WITH DIFFERENTIAL/PLATELET
BASOS ABS: 0 10*3/uL (ref 0.0–0.1)
BASOS PCT: 0.4 % (ref 0.0–3.0)
Eosinophils Absolute: 0.1 10*3/uL (ref 0.0–0.7)
Eosinophils Relative: 1.5 % (ref 0.0–5.0)
HCT: 41.5 % (ref 36.0–46.0)
HEMOGLOBIN: 13.9 g/dL (ref 12.0–15.0)
Lymphocytes Relative: 47.5 % — ABNORMAL HIGH (ref 12.0–46.0)
Lymphs Abs: 2 10*3/uL (ref 0.7–4.0)
MCHC: 33.5 g/dL (ref 30.0–36.0)
MCV: 90.8 fl (ref 78.0–100.0)
MONOS PCT: 8.1 % (ref 3.0–12.0)
Monocytes Absolute: 0.3 10*3/uL (ref 0.1–1.0)
NEUTROS ABS: 1.8 10*3/uL (ref 1.4–7.7)
NEUTROS PCT: 42.5 % — AB (ref 43.0–77.0)
Platelets: 279 10*3/uL (ref 150.0–400.0)
RBC: 4.57 Mil/uL (ref 3.87–5.11)
RDW: 13 % (ref 11.5–15.5)
WBC: 4.2 10*3/uL (ref 4.0–10.5)

## 2014-09-23 LAB — TSH: TSH: 2.9 u[IU]/mL (ref 0.35–4.50)

## 2014-09-23 LAB — VITAMIN D 25 HYDROXY (VIT D DEFICIENCY, FRACTURES): VITD: 57.94 ng/mL (ref 30.00–100.00)

## 2014-09-25 LAB — NMR LIPOPROFILE WITH LIPIDS
Cholesterol, Total: 253 mg/dL — ABNORMAL HIGH (ref 100–199)
HDL Particle Number: 42.3 umol/L (ref >=30.5)
HDL SIZE: 9.9 nm (ref >=9.2)
HDL-C: 95 mg/dL (ref >39)
LARGE HDL: 15.2 umol/L (ref >=4.8)
LDL (calc): 147 mg/dL — ABNORMAL HIGH (ref 0–99)
LDL Particle Number: 1485 nmol/L — ABNORMAL HIGH (ref <1000)
LDL Size: 21.4 nm (ref >=20.8)
LP-IR Score: <25 (ref <=45)
Small LDL Particle Number: 236 nmol/L (ref <=527)
TRIGLYCERIDES: 54 mg/dL (ref 0–149)
VLDL Size: 37 nm (ref <=46.6)

## 2014-10-20 DIAGNOSIS — S99929A Unspecified injury of unspecified foot, initial encounter: Secondary | ICD-10-CM | POA: Insufficient documentation

## 2015-02-03 ENCOUNTER — Other Ambulatory Visit: Payer: Self-pay

## 2015-02-03 DIAGNOSIS — Z1231 Encounter for screening mammogram for malignant neoplasm of breast: Secondary | ICD-10-CM

## 2015-02-25 ENCOUNTER — Ambulatory Visit: Payer: Self-pay

## 2015-03-02 ENCOUNTER — Ambulatory Visit
Admission: RE | Admit: 2015-03-02 | Discharge: 2015-03-02 | Disposition: A | Discharge status: Home / Self Care | Payer: 59 | Source: Ambulatory Visit

## 2015-03-02 DIAGNOSIS — Z1231 Encounter for screening mammogram for malignant neoplasm of breast: Secondary | ICD-10-CM

## 2015-03-14 IMAGING — CR DG CHEST 2V
2 series · 2 of 2 positions shown · non-contrast
Comparison: None.

CLINICAL DATA: Shortness of breath, cough

EXAM:
CHEST  2 VIEW

[w chest pa]
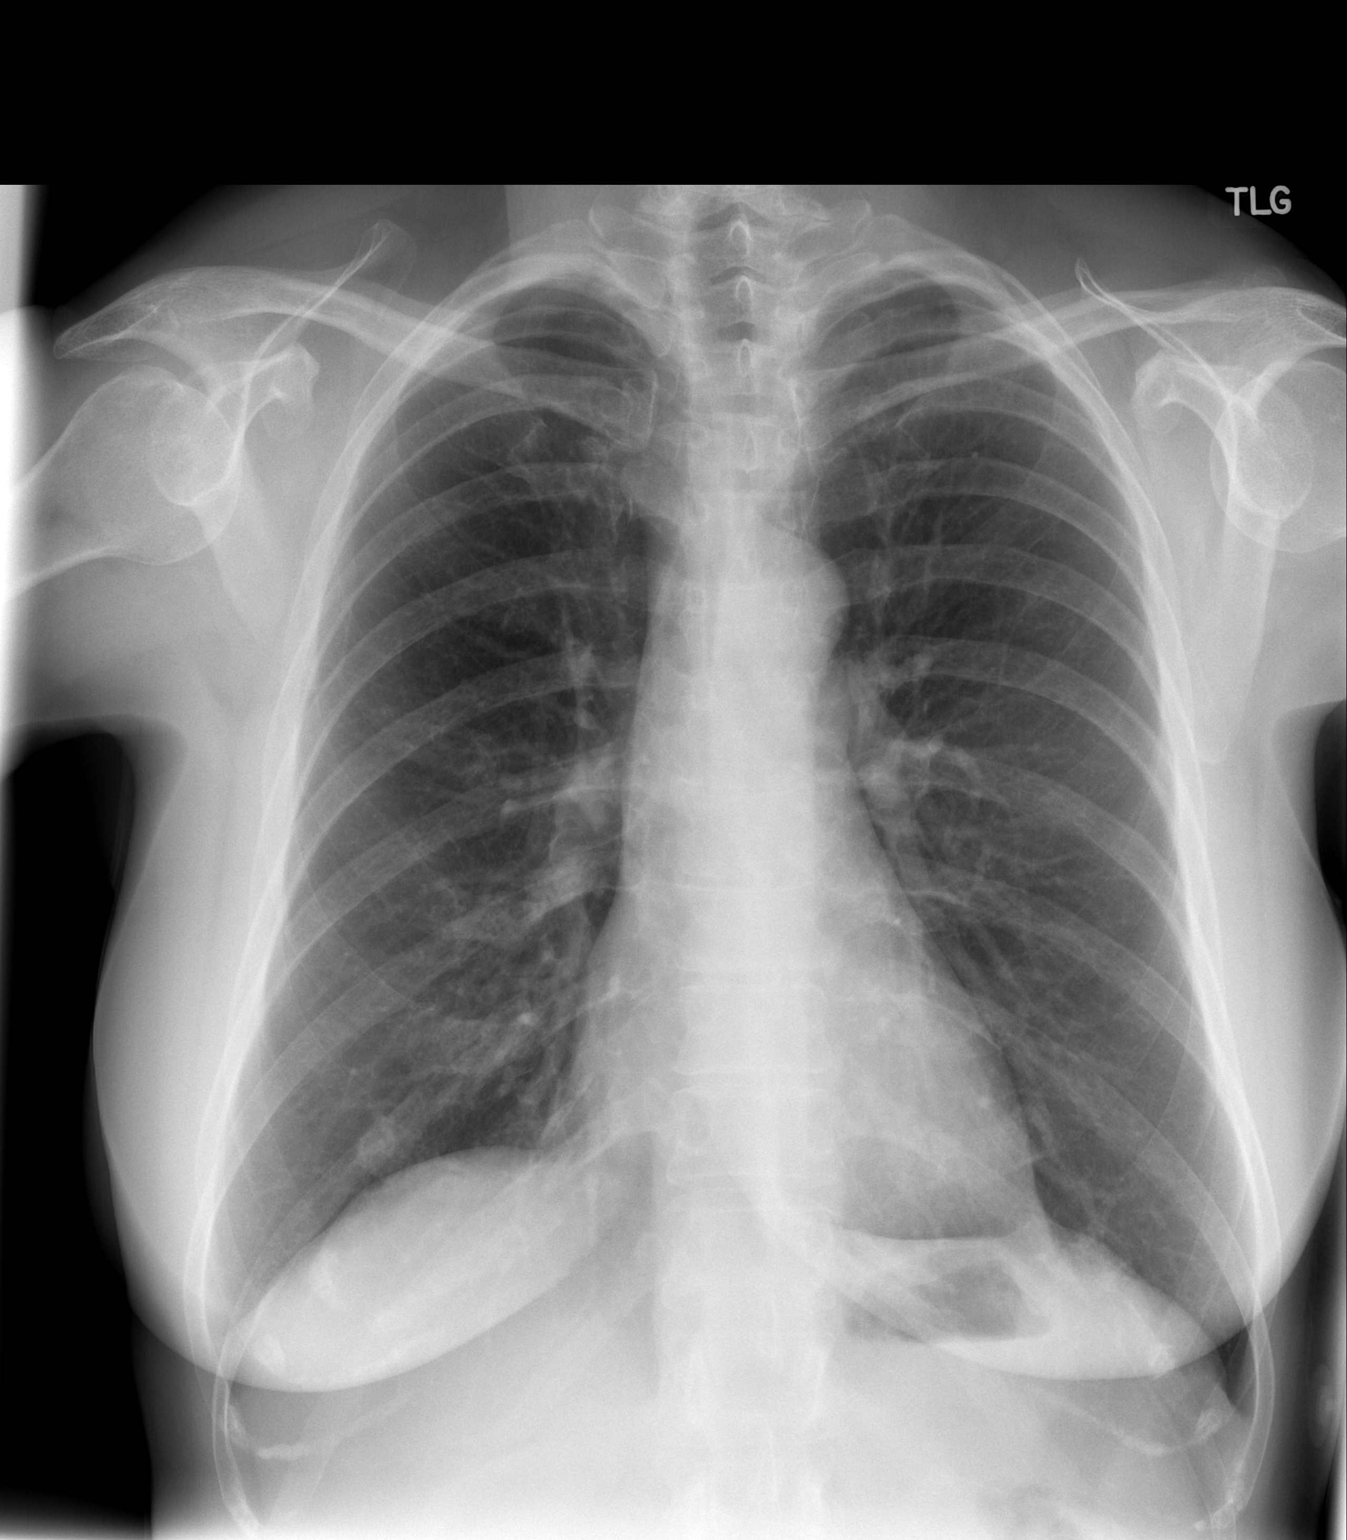

[w chest lat]
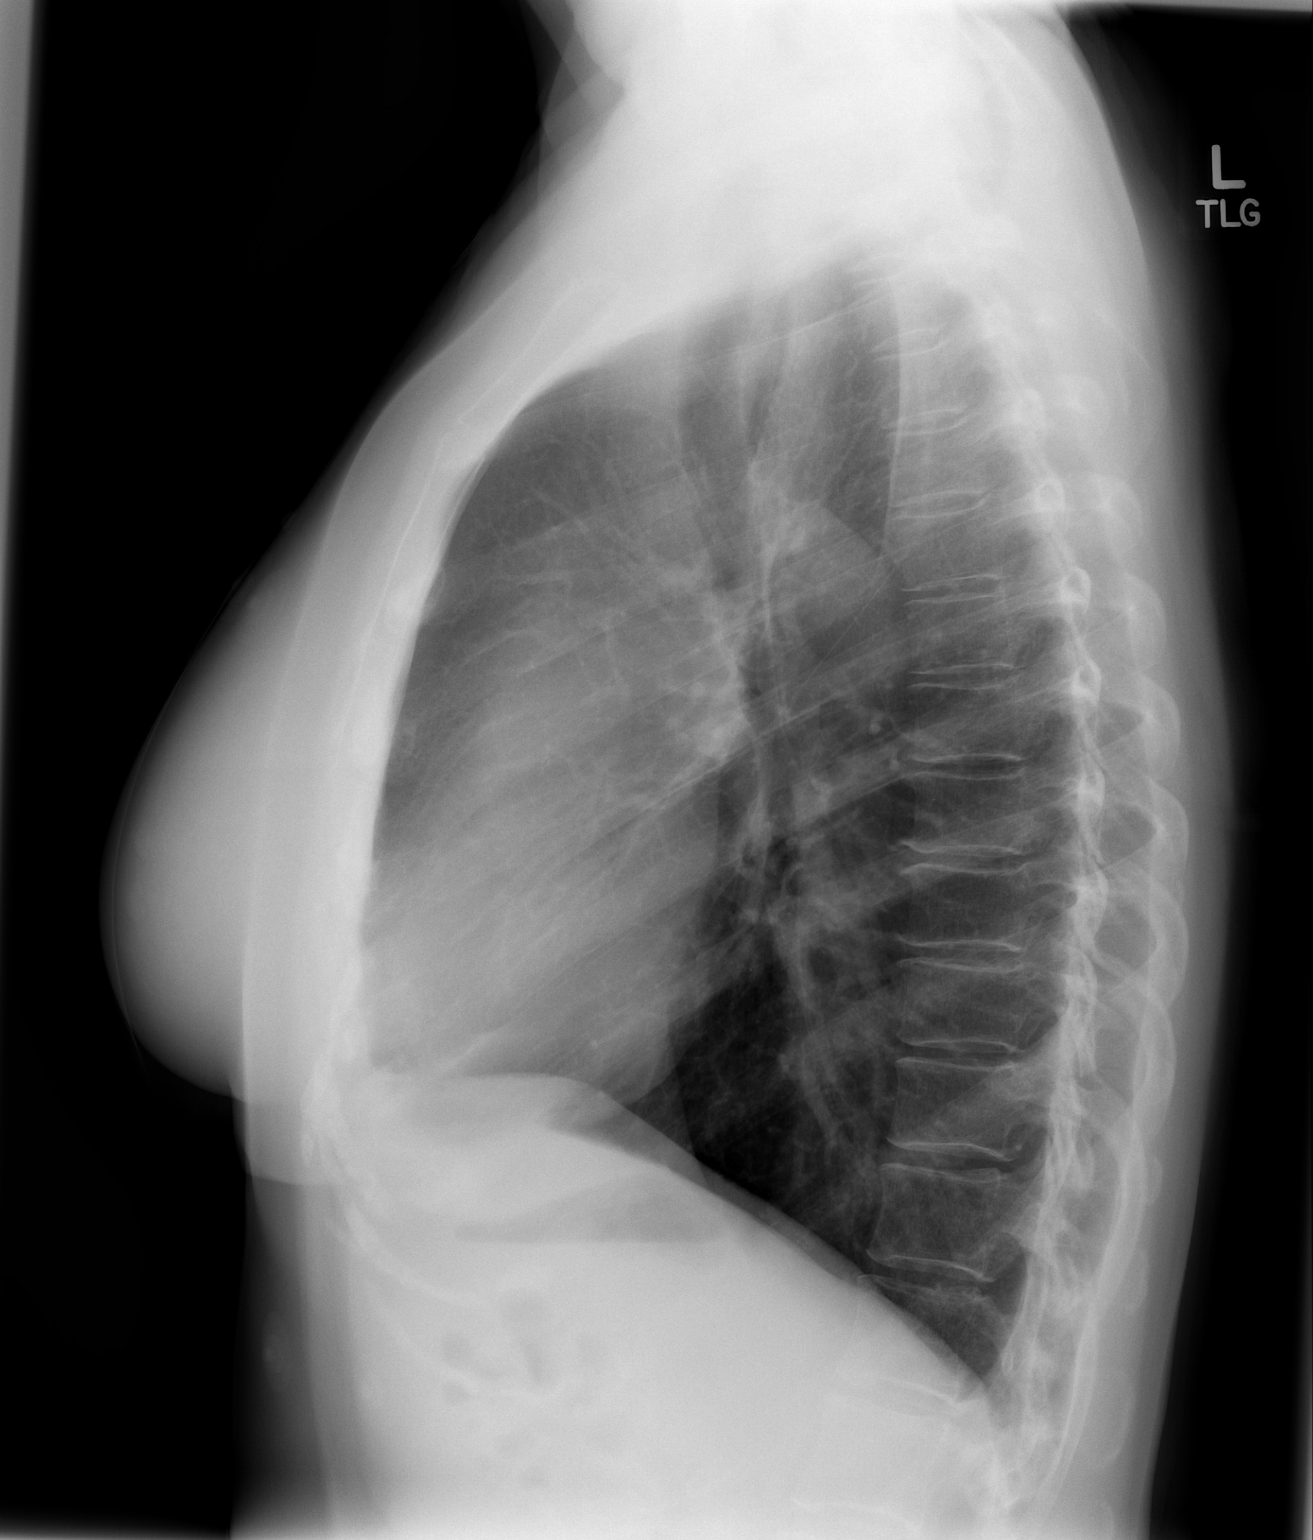

[2 of 2 positions shown; findings below may reference images not displayed]

FINDINGS: The heart size and mediastinal contours are within normal limits.
There is no focal infiltrate, pulmonary edema, or pleural effusion.
Nodular densities project in the right lung base consistent with rib
end. The visualized skeletal structures are unremarkable.
IMPRESSION: No active cardiopulmonary disease.

## 2015-06-02 ENCOUNTER — Other Ambulatory Visit (INDEPENDENT_AMBULATORY_CARE_PROVIDER_SITE_OTHER): Payer: 59

## 2015-06-02 ENCOUNTER — Encounter: Payer: Self-pay | Admitting: Internal Medicine

## 2015-06-02 ENCOUNTER — Ambulatory Visit (INDEPENDENT_AMBULATORY_CARE_PROVIDER_SITE_OTHER): Payer: 59 | Admitting: Internal Medicine

## 2015-06-02 VITALS — BP 130/80 | HR 80 | Temp 97.7°F | Resp 18 | Wt 140.0 lb

## 2015-06-02 DIAGNOSIS — M609 Myositis, unspecified: Secondary | ICD-10-CM

## 2015-06-02 DIAGNOSIS — Z658 Other specified problems related to psychosocial circumstances: Secondary | ICD-10-CM

## 2015-06-02 DIAGNOSIS — IMO0001 Reserved for inherently not codable concepts without codable children: Secondary | ICD-10-CM

## 2015-06-02 DIAGNOSIS — M791 Myalgia: Secondary | ICD-10-CM

## 2015-06-02 DIAGNOSIS — F439 Reaction to severe stress, unspecified: Secondary | ICD-10-CM

## 2015-06-02 LAB — VITAMIN D 25 HYDROXY (VIT D DEFICIENCY, FRACTURES): VITD: 39.57 ng/mL (ref 30.00–100.00)

## 2015-06-02 LAB — MAGNESIUM: MAGNESIUM: 2 mg/dL (ref 1.5–2.5)

## 2015-06-02 LAB — CALCIUM: Calcium: 9.2 mg/dL (ref 8.4–10.5)

## 2015-06-02 LAB — SEDIMENTATION RATE: Sed Rate: 12 mm/hr (ref 0–22)

## 2015-06-02 LAB — CK: Total CK: 80 U/L (ref 7–177)

## 2015-06-02 LAB — POTASSIUM: POTASSIUM: 4 meq/L (ref 3.5–5.1)

## 2015-06-02 NOTE — Progress Notes (Signed)
   Subjective:    Patient ID: Alicia Hensley, female    DOB: 1961-08-21, 54 y.o.   MRN: [id number]  HPI  Since May 23 she's had back discomfort. She was very physically active including dancing the day prior to onset of symptoms. She noted a "pop" while doing the electric slide dance. Initially the pain was in the midthoracic area at the bra line. It subsequently moved up to the upper back bilaterally. She used an over-the-counter TENS or magnet type unit and has been 100% better over the last 48 hours. During this time she's also had some "flu like" symptoms, as well as decreased strength in her arms.  When present the pain was described as dull and aching up to level VI-VII &  constant. In the first week of June she had some tingling in her arms as well ; but this has resolved. She may have had some slight imbalance.  She has been under stress as her mother's been diagnosed with ALS. Her daughter is leaving for college later this Summer. Her appetite is poor. She does have some restless sleep. She questions some depression. Her gynecologist prescribed sertraline 50 mg but she felt this made her too drowsy. He also prescribed clonazepam which she has not filled.  She's not on a statin  Review of Systems Fever, chills, sweats, or unexplained weight loss not present. No significant headaches. Mental status change or memory loss denied. Blurred vision , diplopia or vision loss absent. Vertigo or near syncope denied.  No loss of control of bladder or bowels. Radicular type pain absent. No seizure stigmata.     Objective:   Physical Exam  General appearance :adequately nourished; in no distress.  Eyes: No conjunctival inflammation or scleral icterus is present.  Oral exam:  Lips and gums are healthy appearing.There is no oropharyngeal erythema or exudate noted. Dental hygiene is good.Upper and lower braces are present  Heart:  Normal rate and regular rhythm. S1 and S2 normal without gallop,  murmur, click, rub or other extra sounds    Lungs:Chest clear to auscultation; no wheezes, rhonchi,rales ,or rubs present.No increased work of breathing.   Abdomen: bowel sounds normal, soft and non-tender without masses, organomegaly or hernias noted.  No guarding or rebound. No flank tenderness to percussion.  Vascular : all pulses equal ; no bruits present.  Skin:Warm & dry.  Intact without suspicious lesions or rashes ; no tenting or jaundice   Lymphatic: No lymphadenopathy is noted about the head, neck, axilla, or inguinal areas.   Neurologic exam : Cn 2-7 intact Strength equal & normal in upper & lower extremities Able to walk on heels and toes.   Balance normal  Romberg normal, finger to nose normal DTRs normal.       Assessment & Plan:  #1 myalgia; no neurologic deficit  #2 exogenous stress related to family issues  See orders.The pathophysiology of neurotransmitter deficiency was discussed along with the benefits and potential adverse effects of SSRI therapy.

## 2015-06-02 NOTE — Patient Instructions (Signed)
Please reconsider taking the agent to raise the neurotransmitters which are essential for good brain function, both intellectual & emotional health. These agents are not addictive and simply keep this essential neurotransmitter at therapeutic levels. If these levels become severely depleted; depression or panic attacks can occur.

## 2015-06-02 NOTE — Progress Notes (Signed)
Pre visit review using our clinic review tool, if applicable. No additional management support is needed unless otherwise documented below in the visit note. 

## 2015-09-12 ENCOUNTER — Emergency Department (HOSPITAL_BASED_OUTPATIENT_CLINIC_OR_DEPARTMENT_OTHER)
Admission: EM | Admit: 2015-09-12 | Discharge: 2015-09-12 | Disposition: A | Discharge status: Home / Self Care | Payer: 59 | Attending: Emergency Medicine | Admitting: Emergency Medicine

## 2015-09-12 ENCOUNTER — Emergency Department (HOSPITAL_BASED_OUTPATIENT_CLINIC_OR_DEPARTMENT_OTHER): Payer: 59

## 2015-09-12 ENCOUNTER — Encounter (HOSPITAL_BASED_OUTPATIENT_CLINIC_OR_DEPARTMENT_OTHER): Payer: Self-pay

## 2015-09-12 DIAGNOSIS — S50811A Abrasion of right forearm, initial encounter: Secondary | ICD-10-CM | POA: Insufficient documentation

## 2015-09-12 DIAGNOSIS — Z87891 Personal history of nicotine dependence: Secondary | ICD-10-CM | POA: Diagnosis not present

## 2015-09-12 DIAGNOSIS — E785 Hyperlipidemia, unspecified: Secondary | ICD-10-CM | POA: Diagnosis not present

## 2015-09-12 DIAGNOSIS — S82831A Other fracture of upper and lower end of right fibula, initial encounter for closed fracture: Secondary | ICD-10-CM | POA: Insufficient documentation

## 2015-09-12 DIAGNOSIS — Y92828 Other wilderness area as the place of occurrence of the external cause: Secondary | ICD-10-CM | POA: Diagnosis not present

## 2015-09-12 DIAGNOSIS — S82401A Unspecified fracture of shaft of right fibula, initial encounter for closed fracture: Secondary | ICD-10-CM

## 2015-09-12 DIAGNOSIS — Z8719 Personal history of other diseases of the digestive system: Secondary | ICD-10-CM | POA: Insufficient documentation

## 2015-09-12 DIAGNOSIS — Z79899 Other long term (current) drug therapy: Secondary | ICD-10-CM | POA: Diagnosis not present

## 2015-09-12 DIAGNOSIS — Z85828 Personal history of other malignant neoplasm of skin: Secondary | ICD-10-CM | POA: Insufficient documentation

## 2015-09-12 DIAGNOSIS — Y9389 Activity, other specified: Secondary | ICD-10-CM | POA: Diagnosis not present

## 2015-09-12 DIAGNOSIS — Z86010 Personal history of colonic polyps: Secondary | ICD-10-CM | POA: Diagnosis not present

## 2015-09-12 DIAGNOSIS — S82101A Unspecified fracture of upper end of right tibia, initial encounter for closed fracture: Secondary | ICD-10-CM | POA: Diagnosis not present

## 2015-09-12 DIAGNOSIS — S82141A Displaced bicondylar fracture of right tibia, initial encounter for closed fracture: Secondary | ICD-10-CM

## 2015-09-12 DIAGNOSIS — Y998 Other external cause status: Secondary | ICD-10-CM | POA: Diagnosis not present

## 2015-09-12 DIAGNOSIS — S8991XA Unspecified injury of right lower leg, initial encounter: Secondary | ICD-10-CM | POA: Diagnosis present

## 2015-09-12 MED ORDER — OXYCODONE-ACETAMINOPHEN 5-325 MG PO TABS
1.0000 | ORAL_TABLET | Freq: Once | ORAL | Status: AC
  Administered 2015-09-12: 1 via ORAL
  Filled 2015-09-12: qty 1

## 2015-09-12 MED ORDER — OXYCODONE-ACETAMINOPHEN 5-325 MG PO TABS: 1.0000 | ORAL_TABLET | Freq: Four times a day (QID) | ORAL | Status: DC | PRN
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
## 2015-09-12 NOTE — Discharge Instructions (Signed)
Please read and follow all provided instructions.  Your diagnoses today include:  1. Tibial plateau fracture, right, closed, initial encounter   2. Closed fibular fracture, right, initial encounter     Tests performed today include:  An x-ray/CT of the affected area - shows fractured tibial plateau and fibular head  Vital signs. See below for your results today.   Medications prescribed:   Percocet (oxycodone/acetaminophen) - narcotic pain medication  DO NOT drive or perform any activities that require you to be awake and alert because this medicine can make you drowsy. BE VERY CAREFUL not to take multiple medicines containing Tylenol (also called acetaminophen). Doing so can lead to an overdose which can damage your liver and cause liver failure and possibly death.  Take any prescribed medications only as directed.  Home care instructions:   Follow any educational materials contained in this packet  Follow R.I.C.E. Protocol:  R - rest your injury   I  - use ice on injury without applying directly to skin  C - compress injury with bandage or splint  E - elevate the injury as much as possible  Follow-up instructions: Please follow-up with the provided orthopedic physician (bone specialist) tomorrow.   Return instructions:   Please return if your toes or feet are numb or tingling, appear gray or blue, or you have severe pain (also elevate the leg and loosen splint or wrap if you were given one)  Please return to the Emergency Department if you experience worsening symptoms.   Please return if you have any other emergent concerns.  Additional Information:  Your vital signs today were: BP 112/68 mmHg   Pulse 62   Temp(Src) 98.3 F (36.8 C)   Resp 16   SpO2 98% If your blood pressure (BP) was elevated above 135/85 this visit, please have this repeated by your doctor within one month. --------------
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
## 2015-11-04 ENCOUNTER — Other Ambulatory Visit: Payer: Self-pay | Admitting: Orthopaedic Surgery

## 2015-11-04 DIAGNOSIS — M25561 Pain in right knee: Secondary | ICD-10-CM

## 2015-11-05 ENCOUNTER — Other Ambulatory Visit (INDEPENDENT_AMBULATORY_CARE_PROVIDER_SITE_OTHER): Payer: 59

## 2015-11-05 ENCOUNTER — Ambulatory Visit (INDEPENDENT_AMBULATORY_CARE_PROVIDER_SITE_OTHER): Payer: 59 | Admitting: Internal Medicine

## 2015-11-05 ENCOUNTER — Encounter: Payer: Self-pay | Admitting: Internal Medicine

## 2015-11-05 VITALS — BP 124/76 | HR 87 | Temp 98.0°F | Resp 16 | Wt 147.0 lb

## 2015-11-05 DIAGNOSIS — Z0189 Encounter for other specified special examinations: Secondary | ICD-10-CM

## 2015-11-05 DIAGNOSIS — Z0000 Encounter for general adult medical examination without abnormal findings: Secondary | ICD-10-CM

## 2015-11-05 DIAGNOSIS — Z23 Encounter for immunization: Secondary | ICD-10-CM | POA: Diagnosis not present

## 2015-11-05 LAB — BASIC METABOLIC PANEL
BUN: 12 mg/dL (ref 6–23)
CHLORIDE: 103 meq/L (ref 96–112)
CO2: 29 meq/L (ref 19–32)
Calcium: 9.1 mg/dL (ref 8.4–10.5)
Creatinine, Ser: 0.74 mg/dL (ref 0.40–1.20)
GFR: 86.56 mL/min (ref >60.00)
GLUCOSE: 94 mg/dL (ref 70–99)
Potassium: 4 mEq/L (ref 3.5–5.1)
Sodium: 141 mEq/L (ref 135–145)

## 2015-11-05 LAB — CBC WITH DIFFERENTIAL/PLATELET
BASOS PCT: 0.5 % (ref 0.0–3.0)
Basophils Absolute: 0 10*3/uL (ref 0.0–0.1)
EOS PCT: 1.7 % (ref 0.0–5.0)
Eosinophils Absolute: 0.1 10*3/uL (ref 0.0–0.7)
HCT: 38.6 % (ref 36.0–46.0)
Hemoglobin: 13.1 g/dL (ref 12.0–15.0)
LYMPHS ABS: 2.1 10*3/uL (ref 0.7–4.0)
Lymphocytes Relative: 38.7 % (ref 12.0–46.0)
MCHC: 33.8 g/dL (ref 30.0–36.0)
MCV: 89.9 fl (ref 78.0–100.0)
Monocytes Absolute: 0.5 10*3/uL (ref 0.1–1.0)
Monocytes Relative: 8.5 % (ref 3.0–12.0)
NEUTROS ABS: 2.8 10*3/uL (ref 1.4–7.7)
NEUTROS PCT: 50.6 % (ref 43.0–77.0)
Platelets: 277 10*3/uL (ref 150.0–400.0)
RBC: 4.29 Mil/uL (ref 3.87–5.11)
RDW: 12.9 % (ref 11.5–15.5)
WBC: 5.6 10*3/uL (ref 4.0–10.5)

## 2015-11-05 LAB — LIPID PANEL
CHOL/HDL RATIO: 3
Cholesterol: 240 mg/dL — ABNORMAL HIGH (ref 0–200)
HDL: 79.9 mg/dL (ref >39.00)
LDL Cholesterol: 140 mg/dL — ABNORMAL HIGH (ref 0–99)
NONHDL: 160.14
TRIGLYCERIDES: 103 mg/dL (ref 0.0–149.0)
VLDL: 20.6 mg/dL (ref 0.0–40.0)

## 2015-11-05 LAB — HEPATIC FUNCTION PANEL
ALK PHOS: 64 U/L (ref 39–117)
ALT: 13 U/L (ref 0–35)
AST: 18 U/L (ref 0–37)
Albumin: 4.2 g/dL (ref 3.5–5.2)
BILIRUBIN DIRECT: 0.1 mg/dL (ref 0.0–0.3)
TOTAL PROTEIN: 6.8 g/dL (ref 6.0–8.3)
Total Bilirubin: 0.5 mg/dL (ref 0.2–1.2)

## 2015-11-05 LAB — VITAMIN D 25 HYDROXY (VIT D DEFICIENCY, FRACTURES): VITD: 42.86 ng/mL (ref 30.00–100.00)

## 2015-11-05 LAB — TSH: TSH: 3.72 u[IU]/mL (ref 0.35–4.50)
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
## 2015-11-05 NOTE — Patient Instructions (Signed)
  Your next office appointment will be determined based upon review of your pending labs. Those written interpretation of the lab results and instructions will be transmitted to you by My Chart  Critical results will be called.   Followup as needed for any active or acute issue. Please report any significant change in your symptoms. 

## 2015-11-05 NOTE — Progress Notes (Signed)
Pre visit review using our clinic review tool, if applicable. No additional management support is needed unless otherwise documented below in the visit note. 

## 2015-11-24 ENCOUNTER — Ambulatory Visit
Admission: RE | Admit: 2015-11-24 | Discharge: 2015-11-24 | Disposition: A | Discharge status: Home / Self Care | Payer: 59 | Source: Ambulatory Visit | Attending: Orthopaedic Surgery | Admitting: Orthopaedic Surgery

## 2015-11-24 DIAGNOSIS — M25561 Pain in right knee: Secondary | ICD-10-CM

## 2016-04-03 ENCOUNTER — Other Ambulatory Visit: Payer: Self-pay

## 2016-04-03 DIAGNOSIS — Z1231 Encounter for screening mammogram for malignant neoplasm of breast: Secondary | ICD-10-CM

## 2016-04-17 ENCOUNTER — Ambulatory Visit: Payer: Self-pay | Admitting: Surgery

## 2016-04-17 NOTE — H&P (Signed)
Alicia Hensley 04/17/2016 2:32 PM Location: [facility] Patient #: [id number] DOB: [birth year] Married / Language: English / Race: White Female  History of Present Illness Adin Hector MD; 04/17/2016 3:22 PM) The patient is a 55 year old female who presents with an umbilical hernia. Note for "Umbilical hernia": Patient sent by her gynecologist, Dr. Arnetha Gula, for concern of umbilical hernia.  Pleasant active woman. Likes to work out at [employer]. A lot. Had a tubal ligation but no other abdominal surgeries. Has noticed a lump at her bellybutton for many years. Gotten larger over time. He is now out all the time. Unsightly. Gives her occasional twinges of discomfort as well, especially while working out. Because of increasing size and symptoms. Surgical consultation requested. She can walk several miles without difficulty. She does not smoke. BM every day. No skin infections or MRSA.   Other Problems Elbert Ewings, CMA; 04/17/2016 2:32 PM) Hemorrhoids  Past Surgical History Elbert Ewings, CMA; 04/17/2016 2:32 PM) No pertinent past surgical history  Diagnostic Studies History Elbert Ewings, [place]; 04/17/2016 2:32 PM) Colonoscopy 1-5 years ago Mammogram within last year Pap Smear 1-5 years ago  Allergies Elbert Ewings, CMA; 04/17/2016 2:33 PM) Biotin *VITAMINS* Itching.  Medication History Elbert Ewings, CMA; 04/17/2016 2:34 PM) ValACYclovir HCl (500MG  Tablet, Oral) Active. Vitamin C (100MG  Tablet Chewable, Oral) Active. Vitamin D3 Active. Vitamin B12 (100MCG Tablet, Oral) Active. Multiple Vitamin (Oral) Active. Probiotic (Oral) Active. Medications Reconciled  Social History Elbert Ewings, [place]; 04/17/2016 2:32 PM) Alcohol use Occasional alcohol use. Caffeine use Coffee. Tobacco use Former smoker.  Family History Elbert Ewings, [place]; 04/17/2016 2:32 PM) Alcohol Abuse Father. Melanoma Sister.  Pregnancy / Birth History Elbert Ewings, CMA; 04/17/2016 2:32  PM) Age at menarche 1 years. Age of menopause <44 Gravida 2 Irregular periods Maternal age 68-30 Para 2     Review of Systems Elbert Ewings CMA; 04/17/2016 2:32 PM) General Not Present- Appetite Loss, Chills, Fatigue, Fever, Night Sweats, Weight Gain and Weight Loss. Skin Not Present- Change in Wart/Mole, Dryness, Hives, Jaundice, New Lesions, Non-Healing Wounds, Rash and Ulcer. HEENT Present- Seasonal Allergies and Wears glasses/contact lenses. Not Present- Earache, Hearing Loss, Hoarseness, Nose Bleed, Oral Ulcers, Ringing in the Ears, Sinus Pain, Sore Throat, Visual Disturbances and Yellow Eyes. Respiratory Present- Snoring. Not Present- Bloody sputum, Chronic Cough, Difficulty Breathing and Wheezing. Breast Not Present- Breast Mass, Breast Pain, Nipple Discharge and Skin Changes. Cardiovascular Not Present- Chest Pain, Difficulty Breathing Lying Down, Leg Cramps, Palpitations, Rapid Heart Rate, Shortness of Breath and Swelling of Extremities. Gastrointestinal Not Present- Abdominal Pain, Bloating, Bloody Stool, Change in Bowel Habits, Chronic diarrhea, Constipation, Difficulty Swallowing, Excessive gas, Gets full quickly at meals, Hemorrhoids, Indigestion, Nausea, Rectal Pain and Vomiting. Female Genitourinary Not Present- Frequency, Nocturia, Painful Urination, Pelvic Pain and Urgency. Musculoskeletal Not Present- Back Pain, Joint Pain, Joint Stiffness, Muscle Pain, Muscle Weakness and Swelling of Extremities. Neurological Not Present- Decreased Memory, Fainting, Headaches, Numbness, Seizures, Tingling, Tremor, Trouble walking and Weakness. Psychiatric Not Present- Anxiety, Bipolar, Change in Sleep Pattern, Depression, Fearful and Frequent crying. Endocrine Not Present- Cold Intolerance, Excessive Hunger, Hair Changes, Heat Intolerance, Hot flashes and New Diabetes. Hematology Not Present- Easy Bruising, Excessive bleeding, Gland problems, HIV and Persistent Infections.  Vitals  Elbert Ewings CMA; 04/17/2016 2:35 PM) 04/17/2016 2:34 PM Weight: 149 lb Height: 66in Body Surface Area: 1.76 m Body Mass Index: 24.05 kg/m  Temp.: 97.74F  Pulse: 86 (Regular)  BP: 122/72 (Sitting, Left Arm, Standard)  Physical Exam Adin Hector MD; 04/17/2016 3:21 PM)  General Mental Status-Alert. General Appearance-Not in acute distress, Not Sickly. Orientation-Oriented X3. Hydration-Well hydrated. Voice-Normal.  Integumentary Global Assessment Upon inspection and palpation of skin surfaces of the - Axillae: non-tender, no inflammation or ulceration, no drainage. and Distribution of scalp and body hair is normal. General Characteristics Temperature - normal warmth is noted.  Head and Neck Head-normocephalic, atraumatic with no lesions or palpable masses. Face Global Assessment - atraumatic, no absence of expression. Neck Global Assessment - no abnormal movements, no bruit auscultated on the right, no bruit auscultated on the left, no decreased range of motion, non-tender. Trachea-midline. Thyroid Gland Characteristics - non-tender.  Eye Eyeball - Left-Extraocular movements intact, No Nystagmus. Eyeball - Right-Extraocular movements intact, No Nystagmus. Cornea - Left-No Hazy. Cornea - Right-No Hazy. Sclera/Conjunctiva - Left-No scleral icterus, No Discharge. Sclera/Conjunctiva - Right-No scleral icterus, No Discharge. Pupil - Left-Direct reaction to light normal. Pupil - Right-Direct reaction to light normal.  ENMT Ears Pinna - Left - no drainage observed, no generalized tenderness observed. Right - no drainage observed, no generalized tenderness observed. Nose and Sinuses External Inspection of the Nose - no destructive lesion observed. Inspection of the nares - Left - quiet respiration. Right - quiet respiration. Mouth and Throat Lips - Upper Lip - no fissures observed, no pallor noted. Lower Lip - no fissures  observed, no pallor noted. Nasopharynx - no discharge present. Oral Cavity/Oropharynx - Tongue - no dryness observed. Oral Mucosa - no cyanosis observed. Hypopharynx - no evidence of airway distress observed.  Chest and Lung Exam Inspection Movements - Normal and Symmetrical. Accessory muscles - No use of accessory muscles in breathing. Palpation Palpation of the chest reveals - Non-tender. Auscultation Breath sounds - Normal and Clear.  Cardiovascular Auscultation Rhythm - Regular. Murmurs & Other Heart Sounds - Auscultation of the heart reveals - No Murmurs and No Systolic Clicks.  Abdomen Inspection Inspection of the abdomen reveals - No Visible peristalsis and No Abnormal pulsations. Umbilicus - No Bleeding, No Urine drainage. Palpation/Percussion Palpation and Percussion of the abdomen reveal - Soft, Non Tender, No Rebound tenderness, No Rigidity (guarding) and No Cutaneous hyperesthesia. Note: Soft and flat. 3 x 2 cm umbilical mass. Incarcerated not reducible but soft and ellipsoid. No diastases. No hernias elsewhere. Otherwise nontender nondistended.  Female Genitourinary Sexual Maturity Tanner 5 - Adult hair pattern. Note: No vaginal bleeding nor discharge. No inguinal hernias  Peripheral Vascular Upper Extremity Inspection - Left - No Cyanotic nailbeds, Not Ischemic. Right - No Cyanotic nailbeds, Not Ischemic.  Neurologic Neurologic evaluation reveals -normal attention span and ability to concentrate, able to name objects and repeat phrases. Appropriate fund of knowledge , normal sensation and normal coordination. Mental Status Affect - not angry, not paranoid. Cranial Nerves-Normal Bilaterally. Gait-Normal.  Neuropsychiatric Mental status exam performed with findings of-able to articulate well with normal speech/language, rate, volume and coherence, thought content normal with ability to perform basic computations and apply abstract reasoning and no evidence  of hallucinations, delusions, obsessions or homicidal/suicidal ideation.  Musculoskeletal Global Assessment Spine, Ribs and Pelvis - no instability, subluxation or laxity. Right Upper Extremity - no instability, subluxation or laxity.  Lymphatic Head & Neck  General Head & Neck Lymphatics: Bilateral - Description - No Localized lymphadenopathy. Axillary  General Axillary Region: Bilateral - Description - No Localized lymphadenopathy. Femoral & Inguinal  Generalized Femoral & Inguinal Lymphatics: Left - Description - No Localized lymphadenopathy. Right - Description - No Localized lymphadenopathy.  Assessment & Plan Adin Hector MD; 04/17/2016 3:20 PM)  INCARCERATED UMBILICAL HERNIA ([id number]) Impression: Obvious incarcerated umbilical hernia. Slightly larger and more sensitive.  I think she would benefit from surgical repair, especially given her very active lifestyle and intense gym workouts. I would do underlay repair with mesh to help avoid recurrence given her intense activity level.  She was open to get back to doing abdominal crunches and a few days. I cautioned against her exercising to intensely too quickly. Certainly walking and low impact activity as tolerated right-away, but don't push through pain and pace herself. Make sure she understood and tried not to have unrealistic activity expectations right away. She is interested in proceeding. We will work to Schedule at a convenient time.  ENCOUNTER FOR PRE-OPERATIVE EXAMINATION - [place] [id number])  Current Plans You are being scheduled for surgery - Our schedulers will call you.  You should hear from our office's scheduling department within 5 working days about the location, date, and time of surgery. We try to make accommodations for patient's preferences in scheduling surgery, but sometimes the OR schedule or the surgeon's schedule prevents [place] from making those accommodations.  If you have not heard from our office  [phone]) in 5 working days, call the office and ask for your surgeon's nurse.  If you have other questions about your diagnosis, plan, or surgery, call the office and ask for your surgeon's nurse.  Written instructions provided The anatomy & physiology of the abdominal wall was discussed. The pathophysiology of hernias was discussed. Natural history risks without surgery including progeressive enlargement, pain, incarceration, & strangulation was discussed. Contributors to complications such as smoking, obesity, diabetes, prior surgery, etc were discussed.  I feel the risks of no intervention will lead to serious problems that outweigh the operative risks; therefore, I recommended surgery to reduce and repair the hernia. I explained laparoscopic techniques with possible need for an open approach. I noted the probable use of mesh to patch and/or buttress the hernia repair  Risks such as bleeding, infection, abscess, need for further treatment, heart attack, death, and other risks were discussed. I noted a good likelihood this will help address the problem. Goals of post-operative recovery were discussed as well. Possibility that this will not correct all symptoms was explained. I stressed the importance of low-impact activity, aggressive pain control, avoiding constipation, & not pushing through pain to minimize risk of post-operative chronic pain or injury. Possibility of reherniation especially with smoking, obesity, diabetes, immunosuppression, and other health conditions was discussed. We will work to minimize complications.  An educational handout further explaining the pathology & treatment options was given as well. Questions were answered. The patient expresses understanding & wishes to proceed with surgery.  Pt Education - Pamphlet Given - Laparoscopic Hernia Repair: discussed with patient and provided information. Pt Education - [facility] Hernia Post-Op HCI (Alicia Hensley): discussed with patient and  provided information. Pt Education - [facility] Pain Control (Alicia Hensley)  Adin Hector, M.D., F.A.C.S. Gastrointestinal and Minimally Invasive Surgery [facility] [address], [place], [place] [address] [phone] Main / Paging

## 2016-04-19 ENCOUNTER — Ambulatory Visit: Payer: 59

## 2016-04-20 ENCOUNTER — Ambulatory Visit: Payer: 59

## 2016-08-28 ENCOUNTER — Ambulatory Visit: Payer: Self-pay

## 2016-09-04 ENCOUNTER — Ambulatory Visit: Payer: 59 | Admitting: Internal Medicine

## 2016-09-12 ENCOUNTER — Other Ambulatory Visit: Payer: Self-pay | Admitting: *Deleted

## 2016-09-12 ENCOUNTER — Ambulatory Visit (INDEPENDENT_AMBULATORY_CARE_PROVIDER_SITE_OTHER): Payer: 59 | Admitting: Nurse Practitioner

## 2016-09-12 ENCOUNTER — Encounter: Payer: Self-pay | Admitting: *Deleted

## 2016-09-12 ENCOUNTER — Encounter: Payer: Self-pay | Admitting: Nurse Practitioner

## 2016-09-12 ENCOUNTER — Other Ambulatory Visit: Payer: 59

## 2016-09-12 VITALS — BP 118/86 | HR 88 | Temp 98.7°F | Ht 65.0 in | Wt 147.0 lb

## 2016-09-12 DIAGNOSIS — R0982 Postnasal drip: Secondary | ICD-10-CM | POA: Diagnosis not present

## 2016-09-12 DIAGNOSIS — R058 Other specified cough: Secondary | ICD-10-CM

## 2016-09-12 DIAGNOSIS — R05 Cough: Secondary | ICD-10-CM | POA: Diagnosis not present

## 2016-09-12 MED ORDER — BENZONATATE 100 MG PO CAPS: 100.0000 mg | ORAL_CAPSULE | Freq: Three times a day (TID) | ORAL | 0 refills | Status: DC | PRN

## 2016-09-12 MED ORDER — OMEPRAZOLE 20 MG PO CPDR: 20.0000 mg | DELAYED_RELEASE_CAPSULE | Freq: Every day | ORAL | 0 refills | Status: DC

## 2016-09-12 MED ORDER — CETIRIZINE HCL 10 MG PO TABS: 10.0000 mg | ORAL_TABLET | Freq: Every day | ORAL | 0 refills | Status: DC

## 2016-09-12 MED ORDER — FLUTICASONE PROPIONATE 50 MCG/ACT NA SUSP: 2.0000 | Freq: Every day | NASAL | 0 refills | Status: DC

## 2016-09-12 NOTE — Patient Instructions (Addendum)
Will call with lab results.  Tuberculosis Tuberculosis (TB) is an infection that harms body tissues. TB usually affects your lungs but can affect other parts of your body. There are two stages of TB:   Active TB. This means you have the symptoms of TB, and it can easily spread from person to person (contagious).  Latent TB. This means you do not have any symptoms of TB, and you are not contagious. It is important to get treatment no matter what type of TB you have.   HOME CARE  Take your antibiotic medicine as told by your doctor. Finish it all even if you start to feel better.  Keep all follow-up visits as told by your doctor. This is important.  Tell your doctor about all of the people you live with or with whom you have close contact. Your doctor or the Department of Health may talk to these people about being tested for TB.  Rest as needed.  Do not use any tobacco products, including cigarettes, chewing tobacco, or electronic cigarettes. If you need help quitting, ask your doctor.  Avoid close contact with others, especially infants and older people. Do this until your doctor says you will no longer spread TB.  Cover your mouth and nose when you cough or sneeze. Get rid of used tissues as told by your doctor.  Wash your hands often with soap and water.  Do not go back to work or school until your doctor says it is okay. GET HELP IF:   You have new symptoms.  You are not hungry.  You feel sick to your stomach (nauseous) or throw up (vomit).  Your pee (urine) is dark yellow.  Your skin or the white part of your eyes turns a yellowish color.  Your symptoms do not go away or get worse.  You have a fever. GET HELP RIGHT AWAY IF:   You have chest pain.  You cough up blood.  You have trouble breathing or feel short of breath.  You have a headache or stiff neck. MAKE SURE YOU:  Understand these instructions.  Will watch your condition.  Will get help right away  if you are not doing well or get worse.   This information is not intended to replace advice given to you by your health care provider. Make sure you discuss any questions you have with your health care provider.   Document Released: 10/01/2009 Document Revised: 12/25/2014 Document Reviewed: 03/11/2014 Elsevier Interactive Patient Education [employer].

## 2016-09-12 NOTE — Progress Notes (Signed)
Subjective:  Patient ID: Alicia Hensley, female    DOB: Dec 27, 1960  Age: 55 y.o. MRN: [id number]  CC: Nasal Congestion (pt stated coughing, chest tightness)   Cough  This is a new problem. The current episode started 1 to 4 weeks ago. The problem has been gradually worsening. The problem occurs constantly. The cough is productive of sputum. Associated symptoms include postnasal drip and a sore throat. Pertinent negatives include no chest pain, chills, ear congestion, fever, headaches, heartburn, hemoptysis, myalgias, nasal congestion, rhinorrhea, shortness of breath, weight loss or wheezing. The symptoms are aggravated by lying down. Risk factors for lung disease include travel (travelled to [place] last month, onset of symptoms when she returned). She has tried OTC cough suppressant for the symptoms. The treatment provided mild relief. There is no history of bronchitis, COPD or environmental allergies.    Outpatient Medications Prior to Visit  Medication Sig Dispense Refill  . Ascorbic Acid (VITAMIN C) 1000 MG tablet Take 1,000 mg by mouth 5 (five) times daily.    Marland Kitchen CALCIUM PO Take 2 tablets by mouth at bedtime.    . Cholecalciferol (VITAMIN D PO) Take by mouth.    . Cyanocobalamin (VITAMIN B 12 PO) Take by mouth.    . Multiple Vitamin (MULTIVITAMIN WITH MINERALS) TABS tablet Take 1 tablet by mouth at bedtime.    . Multiple Vitamins-Minerals (ZINC PO) Take by mouth.    . Probiotic Product (PROBIOTIC PO) Take by mouth.    . sertraline (ZOLOFT) 50 MG tablet     . Tetrahydrozoline HCl (VISINE OP) Place 1 drop into both eyes daily as needed (dry eyes/ redness).    . valACYclovir (VALTREX) 500 MG tablet Take 1 tablet by mouth See admin instructions. Take 2 tablets (1000 mg) at onset, then take 1 tablet (500 mg) twice daily for about 3 days    . vitamin E 1000 UNIT capsule Take by mouth.    . oxyCODONE-acetaminophen (PERCOCET/ROXICET) 5-325 MG per tablet Take 1-2 tablets by mouth every 6 (six)  hours as needed for severe pain. 20 tablet 0   No facility-administered medications prior to visit.     ROS See HPI  Objective:  BP 118/86 (BP Location: Left Arm, Patient Position: Sitting, Cuff Size: Normal)   Pulse 88   Temp 98.7 F (37.1 C)   Ht 5\' 5"  (1.651 m)   Wt 147 lb (66.7 kg)   SpO2 98%   BMI 24.46 kg/m   BP Readings from Last 3 Encounters:  09/12/16 118/86  11/05/15 124/76  09/12/15 112/68    Wt Readings from Last 3 Encounters:  09/12/16 147 lb (66.7 kg)  11/05/15 147 lb (66.7 kg)  06/02/15 140 lb (63.5 kg)    Physical Exam  Constitutional: She is oriented to person, place, and time. No distress.  HENT:  Right Ear: Tympanic membrane, external ear and ear canal normal.  Left Ear: Tympanic membrane and ear canal normal.  Nose: Nose normal.  Mouth/Throat: Uvula is midline. Posterior oropharyngeal erythema present. No oropharyngeal exudate.  Eyes: No scleral icterus.  Neck: Normal range of motion. Neck supple. No thyromegaly present.  Pulmonary/Chest: Effort normal and breath sounds normal. No respiratory distress.  Lymphadenopathy:    She has no cervical adenopathy.  Neurological: She is alert and oriented to person, place, and time.  Vitals reviewed.   Lab Results  Component Value Date   WBC 5.6 11/05/2015   HGB 13.1 11/05/2015   HCT 38.6 11/05/2015   PLT 277.0  11/05/2015   GLUCOSE 94 11/05/2015   CHOL 240 (H) 11/05/2015   TRIG 103.0 11/05/2015   HDL 79.90 11/05/2015   LDLDIRECT 153.7 07/16/2013   LDLCALC 140 (H) 11/05/2015   ALT 13 11/05/2015   AST 18 11/05/2015   NA 141 11/05/2015   K 4.0 11/05/2015   CL 103 11/05/2015   CREATININE 0.74 11/05/2015   BUN 12 11/05/2015   CO2 29 11/05/2015   TSH 3.72 11/05/2015   HGBA1C 5.4 09/15/2009    Assessment & Plan:   Alicia Hensley was seen today for nasal congestion.  Diagnoses and all orders for this visit:  Cough with sputum -     AFB culture, blood; Future -     fluticasone (FLONASE) 50 MCG/ACT  nasal spray; Place 2 sprays into both nostrils daily. -     cetirizine (ZYRTEC) 10 MG tablet; Take 1 tablet (10 mg total) by mouth daily. -     omeprazole (PRILOSEC) 20 MG capsule; Take 1 capsule (20 mg total) by mouth daily. -     benzonatate (TESSALON) 100 MG capsule; Take 1 capsule (100 mg total) by mouth 3 (three) times daily as needed for cough.  Post-nasal drip -     AFB culture, blood; Future -     fluticasone (FLONASE) 50 MCG/ACT nasal spray; Place 2 sprays into both nostrils daily. -     cetirizine (ZYRTEC) 10 MG tablet; Take 1 tablet (10 mg total) by mouth daily. -     omeprazole (PRILOSEC) 20 MG capsule; Take 1 capsule (20 mg total) by mouth daily.   I have discontinued Alicia Hensley's oxyCODONE-acetaminophen. I am also having her start on fluticasone, cetirizine, omeprazole, and benzonatate. Additionally, I am having her maintain her valACYclovir, vitamin C, multivitamin with minerals, CALCIUM PO, Tetrahydrozoline HCl (VISINE OP), Cholecalciferol (VITAMIN D PO), Cyanocobalamin (VITAMIN B 12 PO), Multiple Vitamins-Minerals (ZINC PO), Probiotic Product (PROBIOTIC PO), vitamin E, and sertraline.  Meds ordered this encounter  Medications  . fluticasone (FLONASE) 50 MCG/ACT nasal spray    Sig: Place 2 sprays into both nostrils daily.    Dispense:  16 g    Refill:  0    Order Specific Question:   Supervising Provider    Answer:   Cassandria Anger [[id number]]  . cetirizine (ZYRTEC) 10 MG tablet    Sig: Take 1 tablet (10 mg total) by mouth daily.    Dispense:  14 tablet    Refill:  0    Order Specific Question:   Supervising Provider    Answer:   Cassandria Anger [[id number]]  . omeprazole (PRILOSEC) 20 MG capsule    Sig: Take 1 capsule (20 mg total) by mouth daily.    Dispense:  14 capsule    Refill:  0    Order Specific Question:   Supervising Provider    Answer:   Cassandria Anger [[id number]]  . benzonatate (TESSALON) 100 MG capsule    Sig: Take 1 capsule (100 mg total) by mouth 3  (three) times daily as needed for cough.    Dispense:  20 capsule    Refill:  0    Order Specific Question:   Supervising Provider    Answer:   Cassandria Anger [[id number]]    Follow-up: Return if symptoms worsen or fail to improve in 2weeks.  Wilfred Lacy, NP

## 2016-09-15 ENCOUNTER — Telehealth: Payer: Self-pay

## 2016-09-15 NOTE — Telephone Encounter (Signed)
Left message asking patient to call Bryden Darden back---previous blood test ordered only checks for infection, but will not specifically check for TB---at patients earliest convenience, we need to have her come to our lab and get quanteferin test (blood work to check for TB)---if patient is ok with doing this---I can enter lab order--- can talk with Kamaiyah Uselton if any questions--

## 2016-09-18 ENCOUNTER — Telehealth: Payer: Self-pay

## 2016-09-18 ENCOUNTER — Telehealth: Payer: Self-pay | Admitting: Emergency Medicine

## 2016-09-18 DIAGNOSIS — R05 Cough: Secondary | ICD-10-CM

## 2016-09-18 DIAGNOSIS — R053 Chronic cough: Secondary | ICD-10-CM

## 2016-09-18 NOTE — Telephone Encounter (Signed)
Patient coming in for xray and also quanterferon blood test to check for TB--I am entering orders, ok with charlotte

## 2016-09-18 NOTE — Telephone Encounter (Signed)
error 

## 2016-09-18 NOTE — Telephone Encounter (Signed)
Patient is returning your call. Please give her a call back when you get a chance. Thanks.

## 2016-09-19 ENCOUNTER — Other Ambulatory Visit: Payer: 59

## 2016-09-19 ENCOUNTER — Ambulatory Visit (INDEPENDENT_AMBULATORY_CARE_PROVIDER_SITE_OTHER)
Admission: RE | Admit: 2016-09-19 | Discharge: 2016-09-19 | Disposition: A | Discharge status: Home / Self Care | Payer: 59 | Source: Ambulatory Visit | Attending: Nurse Practitioner | Admitting: Nurse Practitioner

## 2016-09-19 DIAGNOSIS — R053 Chronic cough: Secondary | ICD-10-CM

## 2016-09-19 DIAGNOSIS — R05 Cough: Secondary | ICD-10-CM

## 2016-09-19 LAB — CULTURE, BLOOD (SINGLE): Organism ID, Bacteria: NO GROWTH

## 2016-09-19 NOTE — Progress Notes (Signed)
Normal results, see office note

## 2016-09-21 LAB — QUANTIFERON TB GOLD ASSAY (BLOOD)
INTERFERON GAMMA RELEASE ASSAY: NEGATIVE
QUANTIFERON NIL VALUE: 0.05 [IU]/mL
Quantiferon Tb Ag Minus Nil Value: 0 IU/mL

## 2016-09-21 NOTE — Progress Notes (Signed)
Normal results, see office note

## 2016-10-18 ENCOUNTER — Encounter (INDEPENDENT_AMBULATORY_CARE_PROVIDER_SITE_OTHER): Payer: Self-pay | Admitting: Physician Assistant

## 2016-10-18 ENCOUNTER — Ambulatory Visit (INDEPENDENT_AMBULATORY_CARE_PROVIDER_SITE_OTHER): Payer: 59

## 2016-10-18 ENCOUNTER — Ambulatory Visit (INDEPENDENT_AMBULATORY_CARE_PROVIDER_SITE_OTHER): Payer: 59 | Admitting: Physician Assistant

## 2016-10-18 DIAGNOSIS — M25512 Pain in left shoulder: Secondary | ICD-10-CM | POA: Diagnosis not present

## 2016-10-18 MED ORDER — METHYLPREDNISOLONE ACETATE 40 MG/ML IJ SUSP
40.0000 mg | INTRAMUSCULAR | Status: AC | PRN
  Administered 2016-10-18: 40 mg via INTRA_ARTICULAR

## 2016-10-18 MED ORDER — LIDOCAINE HCL 1 % IJ SOLN
1.0000 mL | INTRAMUSCULAR | Status: AC | PRN
  Administered 2016-10-18: 1 mL

## 2016-10-18 NOTE — Progress Notes (Signed)
Office Visit Note   Patient: Alicia Hensley           Date of Birth: 09/03/1961           MRN: [id number] Visit Date: 10/18/2016              Requested by: Hendricks Limes, MD [address] [place], [place] [address] PCP: Unice Cobble, MD   Assessment & Plan: Visit Diagnoses:  1. Acute pain of left shoulder     Plan: She will perform shoulder exercises as shown at home. Pain persisted 2 weeks would recommend MRI of the shoulder rule out internal derangement  Follow-Up Instructions: Return in about 2 weeks (around 11/01/2016).   Orders:  Orders Placed This Encounter  Procedures  . Large Joint Injection/Arthrocentesis  . XR Shoulder Left   No orders of the defined types were placed in this encounter.     Procedures: Large Joint Inj Date/Time: 10/18/2016 4:28 PM Performed by: Pete Pelt Authorized by: Pete Pelt   Consent Given by:  Patient Indications:  Pain Location:  Shoulder Site:  L subacromial bursa Needle Size:  22 G Needle Length:  1.5 inches Approach:  Anterolateral Ultrasound Guidance: No   Fluoroscopic Guidance: No   Arthrogram: No Medications:  1 mL lidocaine 1 %; 40 mg methylPREDNISolone acetate 40 MG/ML Aspiration Attempted: No   Patient tolerance:  Patient tolerated the procedure well with no immediate complications      Clinical Data: No additional findings.   Subjective: Chief Complaint  Patient presents with  . Left Shoulder - Pain, Follow-up    Patient fell 3 weeks ago. States she missed a step and landed on her left shoulder. Pain radiates some down left  Shoulder.  Hurts to do certain movements. When using arm pain is severe. When not using arm pain level is 2/10. She didn't go to the doctor. Pain with arm is with abduction and internal rotation. Denies any radicular symptoms down the right arm. No other injuries at the time of fall     Review of Systems see history of present illness   Objective: Vital Signs: There  were no vitals taken for this visit.  Physical Exam  Constitutional: She is oriented to person, place, and time. She appears well-developed and well-nourished.  Eyes: EOM are normal.  Pulmonary/Chest: Effort normal.  Neurological: She is oriented to person, place, and time.  Skin: Skin is warm and dry.  Psychiatric: She has a normal mood and affect.    Right Shoulder Exam   Range of Motion  Active Abduction: abnormal  Passive Abduction: abnormal  Extension: abnormal  Forward Flexion: abnormal  External Rotation: abnormal   Other  Sensation: normal Pulse: present   Left Shoulder Exam   Tenderness  The patient is experiencing no tenderness.     Range of Motion  Active Abduction: abnormal  Passive Abduction: abnormal  Extension: abnormal  Forward Flexion: abnormal  External Rotation: abnormal   Other  Sensation: normal Pulse: present   Comments:  Left shoulder she has a palpable click with abduction and extreme internal rotation this causes some discomfort otherwise shoulder exams normal she has no tenderness over the before [facility] joint and no pain with cross over test on the left.      Specialty Comments:  No specialty comments available.  Imaging: Xr Shoulder Left  Result Date: 10/18/2016 Radiographs 3 views of the shoulder shows the humeral head be well located. Chronic avulsion off the  acromial side of the before [facility] joint is seen. No acute fractures or subacromial space well maintained    PMFS History: Patient Active Problem List   Diagnosis Date Noted  . Injury of toe 10/20/2014  . Left arm numbness 01/30/2014  . Allergic contact dermatitis 11/19/2013  . Unspecified vitamin D deficiency 07/16/2013  . History of cervical dysplasia 07/16/2013  . Osteopenia 01/18/2012  . DIVERTICULOSIS, COLON 09/19/2010  . COLONIC POLYPS, HX OF 09/19/2010  . POSTMENOPAUSAL SYNDROME 09/15/2009  . SKIN CANCER, HX OF 09/15/2009  . HYPERLIPIDEMIA 01/03/2008   Past  Medical History:  Diagnosis Date  . Basal cell cancer    back & RUE; Dr Tonia Brooms  . Colon polyps   . Diverticulosis   . Hyperlipidemia     Family History  Problem Relation Age of Onset  . Hypertension Mother   . Colon polyps Mother     pre cancerous polyps  . ALS Mother     died 2015/03/28  . Thyroid nodules Sister     benign  . Colon cancer      3 maternal great aunts  . Diabetes Neg Hx   . Stroke Neg Hx   . Heart attack Neg Hx     Past Surgical History:  Procedure Laterality Date  . colonoscopy with polypectomy  2011-03-28   X 1; diverticulosis; Dr Collene Mares  . G 2 P 2    . septal deviation repair    . SQUAMOUS CELL CARCINOMA EXCISION    . throat cyst     resected  . WISDOM TOOTH EXTRACTION     Social History   Occupational History  . retail    Social History Main Topics  . Smoking status: Former Smoker    Quit date: 12/18/2001  . Smokeless tobacco: Never Used     Comment: smoked 1980-2003, up to 1/2 ppd  . Alcohol use 1.8 oz/week    3 Glasses of wine per week  . Drug use: No  . Sexual activity: Not on file

## 2016-11-01 ENCOUNTER — Ambulatory Visit (INDEPENDENT_AMBULATORY_CARE_PROVIDER_SITE_OTHER): Payer: 59 | Admitting: Orthopaedic Surgery

## 2016-11-30 ENCOUNTER — Other Ambulatory Visit: Payer: Self-pay | Admitting: Nurse Practitioner

## 2016-11-30 DIAGNOSIS — R0982 Postnasal drip: Secondary | ICD-10-CM

## 2016-11-30 DIAGNOSIS — R05 Cough: Secondary | ICD-10-CM

## 2016-11-30 DIAGNOSIS — R058 Other specified cough: Secondary | ICD-10-CM

## 2016-11-30 NOTE — Telephone Encounter (Signed)
Routing to [place], please advise, thanks 

## 2016-12-05 ENCOUNTER — Encounter: Payer: Self-pay | Admitting: Internal Medicine

## 2016-12-05 ENCOUNTER — Ambulatory Visit (INDEPENDENT_AMBULATORY_CARE_PROVIDER_SITE_OTHER): Payer: 59 | Admitting: Internal Medicine

## 2016-12-05 VITALS — BP 124/84 | HR 71 | Temp 97.8°F | Resp 16 | Ht 65.0 in | Wt 156.0 lb

## 2016-12-05 DIAGNOSIS — Z114 Encounter for screening for human immunodeficiency virus [HIV]: Secondary | ICD-10-CM

## 2016-12-05 DIAGNOSIS — Z0000 Encounter for general adult medical examination without abnormal findings: Secondary | ICD-10-CM

## 2016-12-05 DIAGNOSIS — Z1159 Encounter for screening for other viral diseases: Secondary | ICD-10-CM | POA: Diagnosis not present

## 2016-12-05 DIAGNOSIS — Z13820 Encounter for screening for osteoporosis: Secondary | ICD-10-CM

## 2016-12-05 DIAGNOSIS — M85869 Other specified disorders of bone density and structure, unspecified lower leg: Secondary | ICD-10-CM | POA: Diagnosis not present

## 2016-12-05 DIAGNOSIS — R200 Anesthesia of skin: Secondary | ICD-10-CM

## 2016-12-05 DIAGNOSIS — E7800 Pure hypercholesterolemia, unspecified: Secondary | ICD-10-CM

## 2016-12-05 DIAGNOSIS — C44712 Basal cell carcinoma of skin of right lower limb, including hip: Secondary | ICD-10-CM

## 2016-12-05 NOTE — Assessment & Plan Note (Signed)
Resolved Likely related to carrying a heavy purse on left shoulder

## 2016-12-05 NOTE — Assessment & Plan Note (Signed)
DEXA due-ordered Stressed calcium and vitamin D Encouraged regular exercise

## 2016-12-05 NOTE — Assessment & Plan Note (Signed)
Sees dermatology annually

## 2016-12-05 NOTE — Progress Notes (Signed)
Pre visit review using our clinic review tool, if applicable. No additional management support is needed unless otherwise documented below in the visit note. 

## 2016-12-05 NOTE — Progress Notes (Signed)
Subjective:    Patient ID: Alicia Hensley, female    DOB: [birth year], 55 y.o.   MRN: [id number]  HPI She is here for a physical exam.   She has no concerns. She denies any changes in her history or family history since she was here last.  She was exercising regularly over the summer, but has not been doing that recently. She has gained some weight.  Medications and allergies reviewed with patient and updated if appropriate.  Patient Active Problem List   Diagnosis Date Noted  . Left arm numbness 01/30/2014  . Unspecified vitamin D deficiency 07/16/2013  . History of cervical dysplasia 07/16/2013  . Osteopenia 01/18/2012  . DIVERTICULOSIS, COLON 09/19/2010  . COLONIC POLYPS, HX OF 09/19/2010  . SKIN CANCER, HX OF 09/15/2009  . HYPERLIPIDEMIA 01/03/2008    Current Outpatient Prescriptions on File Prior to Visit  Medication Sig Dispense Refill  . Ascorbic Acid (VITAMIN C) 1000 MG tablet Take 1,000 mg by mouth 5 (five) times daily.    Marland Kitchen CALCIUM PO Take 2 tablets by mouth at bedtime.    . cetirizine (ZYRTEC) 10 MG tablet Take 1 tablet (10 mg total) by mouth daily. 14 tablet 0  . Cholecalciferol (VITAMIN D PO) Take by mouth.    . Cyanocobalamin (VITAMIN B 12 PO) Take by mouth.    . fluticasone (FLONASE) 50 MCG/ACT nasal spray SHAKE LIQUID AND USE 2 SPRAYS IN EACH NOSTRIL DAILY 16 g 0  . Multiple Vitamin (MULTIVITAMIN WITH MINERALS) TABS tablet Take 1 tablet by mouth at bedtime.    . Multiple Vitamins-Minerals (ZINC PO) Take by mouth.    . Probiotic Product (PROBIOTIC PO) Take by mouth.    . sertraline (ZOLOFT) 50 MG tablet     . Tetrahydrozoline HCl (VISINE OP) Place 1 drop into both eyes daily as needed (dry eyes/ redness).    . valACYclovir (VALTREX) 500 MG tablet Take 1 tablet by mouth See admin instructions. Take 2 tablets (1000 mg) at onset, then take 1 tablet (500 mg) twice daily for about 3 days    . vitamin E 1000 UNIT capsule Take by mouth.     No current  facility-administered medications on file prior to visit.     Past Medical History:  Diagnosis Date  . Basal cell cancer    back & RUE; Dr Tonia Brooms  . Colon polyps   . Diverticulosis   . Hyperlipidemia     Past Surgical History:  Procedure Laterality Date  . colonoscopy with polypectomy  2012   X 1; diverticulosis; Dr Collene Mares  . G 2 P 2    . septal deviation repair    . SQUAMOUS CELL CARCINOMA EXCISION    . throat cyst     resected  . WISDOM TOOTH EXTRACTION      Social History   Social History  . Marital status: Married    Spouse name: N/A  . Number of children: N/A  . Years of education: N/A   Occupational History  . retail    Social History Main Topics  . Smoking status: Former Smoker    Quit date: 12/18/2001  . Smokeless tobacco: Never Used     Comment: smoked 1980-2003, up to 1/2 ppd  . Alcohol use 1.8 oz/week    3 Glasses of wine per week  . Drug use: No  . Sexual activity: Not Asked   Other Topics Concern  . None   Social History Narrative  . None  Family History  Problem Relation Age of Onset  . Hypertension Mother   . Colon polyps Mother     pre cancerous polyps  . ALS Mother     died 03/25/2015  . Thyroid nodules Sister     benign  . Colon cancer      3 maternal great aunts  . Diabetes Neg Hx   . Stroke Neg Hx   . Heart attack Neg Hx     Review of Systems  Constitutional: Negative for appetite change, chills, fatigue, fever and unexpected weight change.  HENT: Negative for hearing loss.   Eyes: Negative for visual disturbance.  Respiratory: Negative for cough, shortness of breath and wheezing.   Cardiovascular: Negative for chest pain, palpitations and leg swelling.  Gastrointestinal: Negative for abdominal pain, blood in stool, constipation, diarrhea and nausea.       No gerd  Genitourinary: Negative for dysuria and hematuria.  Musculoskeletal: Positive for arthralgias (knee pain, right; hips).  Skin: Negative for color change and rash.    Neurological: Negative for dizziness, light-headedness and headaches.  Psychiatric/Behavioral: Negative for dysphoric mood. The patient is not nervous/anxious.        Objective:   Vitals:   12/05/16 1012  BP: 124/84  Pulse: 71  Resp: 16  Temp: 97.8 F (36.6 C)   Filed Weights   12/05/16 1012  Weight: 156 lb (70.8 kg)   Body mass index is 25.96 kg/m.   Physical Exam Constitutional: She appears well-developed and well-nourished. No distress.  HENT:  Head: Normocephalic and atraumatic.  Right Ear: External ear normal. Normal ear canal and TM Left Ear: External ear normal.  Normal ear canal and TM Mouth/Throat: Oropharynx is clear and moist.  Eyes: Conjunctivae and EOM are normal.  Neck: Neck supple. No tracheal deviation present. No thyromegaly present.  No carotid bruit  Cardiovascular: Normal rate, regular rhythm and normal heart sounds.   No murmur heard.  No edema. Pulmonary/Chest: Effort normal and breath sounds normal. No respiratory distress. She has no wheezes. She has no rales.  Breast: deferred to Gyn Abdominal: Soft. She exhibits no distension. There is no tenderness.  Lymphadenopathy: She has no cervical adenopathy.  Skin: Skin is warm and dry. She is not diaphoretic.  Psychiatric: She has a normal mood and affect. Her behavior is normal.         Assessment & Plan:   Physical exam: Screening blood work  ordered Immunizations  Flu shot due Colonoscopy  Up to date  Mammogram  Up to date  Gyn - up-to-date Dexa - due-ordered Eye exams - up-to-date, goes annually EKG - done Mar 24, 2014 Exercise-none regularly, encouraged regular exercise Weight-BMI just above normal, plans to work on weight loss Skin -no concerns, sees dermatology annually Substance abuse- none   Health Maintenance  Topic Date Due  . Hepatitis C Screening  November 08, 1961  . HIV Screening  09/23/1975  . PAP SMEAR  12/19/2015  . DEXA SCAN  09/23/2016  . INFLUENZA VACCINE  03/18/2017  (Originally 07/18/2016)  . MAMMOGRAM  03/01/2017  . COLONOSCOPY  10/28/2019  . TETANUS/TDAP  07/17/2023     See Problem List for Assessment and Plan of chronic medical problems.   Follow-up annually for a physical exam

## 2016-12-05 NOTE — Patient Instructions (Addendum)
Test(s) ordered today. Your results will be released to MyChart (or called to you) after review, usually within 72hours after test completion. If any changes need to be made, you will be notified at that same time.  All other Health Maintenance issues reviewed.   All recommended immunizations and age-appropriate screenings are up-to-date or discussed.  No immunizations administered today.   Medications reviewed and updated.  No changes recommended at this time.   A dexa was ordered.  Please followup in one year   Health Maintenance, Female Introduction Adopting a healthy lifestyle and getting preventive care can go a long way to promote health and wellness. Talk with your health care provider about what schedule of regular examinations is right for you. This is a good chance for you to check in with your provider about disease prevention and staying healthy. In between checkups, there are plenty of things you can do on your own. Experts have done a lot of research about which lifestyle changes and preventive measures are most likely to keep you healthy. Ask your health care provider for more information. Weight and diet Eat a healthy diet  Be sure to include plenty of vegetables, fruits, low-fat dairy products, and lean protein.  Do not eat a lot of foods high in solid fats, added sugars, or salt.  Get regular exercise. This is one of the most important things you can do for your health.  Most adults should exercise for at least 150 minutes each week. The exercise should increase your heart rate and make you sweat (moderate-intensity exercise).  Most adults should also do strengthening exercises at least twice a week. This is in addition to the moderate-intensity exercise. Maintain a healthy weight  Body mass index (BMI) is a measurement that can be used to identify possible weight problems. It estimates body fat based on height and weight. Your health care provider can help  determine your BMI and help you achieve or maintain a healthy weight.  For females 56 years of age and older:  A BMI below 18.5 is considered underweight.  A BMI of 18.5 to 24.9 is normal.  A BMI of 25 to 29.9 is considered overweight.  A BMI of 30 and above is considered obese. Watch levels of cholesterol and blood lipids  You should start having your blood tested for lipids and cholesterol at 56 years of age, then have this test every 5 years.  You may need to have your cholesterol levels checked more often if:  Your lipid or cholesterol levels are high.  You are older than 56 years of age.  You are at high risk for heart disease. Cancer screening Lung Cancer  Lung cancer screening is recommended for adults 71-91 years old who are at high risk for lung cancer because of a history of smoking.  A yearly low-dose CT scan of the lungs is recommended for people who:  Currently smoke.  Have quit within the past 15 years.  Have at least a 30-pack-year history of smoking. A pack year is smoking an average of one pack of cigarettes a day for 1 year.  Yearly screening should continue until it has been 15 years since you quit.  Yearly screening should stop if you develop a health problem that would prevent you from having lung cancer treatment. Breast Cancer  Practice breast self-awareness. This means understanding how your breasts normally appear and feel.  It also means doing regular breast self-exams. Let your health care provider know about  any changes, no matter how small.  If you are in your 20s or 30s, you should have a clinical breast exam (CBE) by a health care provider every 1-3 years as part of a regular health exam.  If you are 54 or older, have a CBE every year. Also consider having a breast X-ray (mammogram) every year.  If you have a family history of breast cancer, talk to your health care provider about genetic screening.  If you are at high risk for breast  cancer, talk to your health care provider about having an MRI and a mammogram every year.  Breast cancer gene (BRCA) assessment is recommended for women who have family members with BRCA-related cancers. BRCA-related cancers include:  Breast.  Ovarian.  Tubal.  Peritoneal cancers.  Results of the assessment will determine the need for genetic counseling and BRCA1 and BRCA2 testing. Cervical Cancer  Your health care provider may recommend that you be screened regularly for cancer of the pelvic organs (ovaries, uterus, and vagina). This screening involves a pelvic examination, including checking for microscopic changes to the surface of your cervix (Pap test). You may be encouraged to have this screening done every 3 years, beginning at age 47.  For women ages 95-65, health care providers may recommend pelvic exams and Pap testing every 3 years, or they may recommend the Pap and pelvic exam, combined with testing for human papilloma virus (HPV), every 5 years. Some types of HPV increase your risk of cervical cancer. Testing for HPV may also be done on women of any age with unclear Pap test results.  Other health care providers may not recommend any screening for nonpregnant women who are considered low risk for pelvic cancer and who do not have symptoms. Ask your health care provider if a screening pelvic exam is right for you.  If you have had past treatment for cervical cancer or a condition that could lead to cancer, you need Pap tests and screening for cancer for at least 20 years after your treatment. If Pap tests have been discontinued, your risk factors (such as having a new sexual partner) need to be reassessed to determine if screening should resume. Some women have medical problems that increase the chance of getting cervical cancer. In these cases, your health care provider may recommend more frequent screening and Pap tests. Colorectal Cancer  This type of cancer can be detected and  often prevented.  Routine colorectal cancer screening usually begins at 56 years of age and continues through 56 years of age.  Your health care provider may recommend screening at an earlier age if you have risk factors for colon cancer.  Your health care provider may also recommend using home test kits to check for hidden blood in the stool.  A small camera at the end of a tube can be used to examine your colon directly (sigmoidoscopy or colonoscopy). This is done to check for the earliest forms of colorectal cancer.  Routine screening usually begins at age 48.  Direct examination of the colon should be repeated every 5-10 years through 56 years of age. However, you may need to be screened more often if early forms of precancerous polyps or small growths are found. Skin Cancer  Check your skin from head to toe regularly.  Tell your health care provider about any new moles or changes in moles, especially if there is a change in a mole's shape or color.  Also tell your health care provider if you  have a mole that is larger than the size of a pencil eraser.  Always use sunscreen. Apply sunscreen liberally and repeatedly throughout the day.  Protect yourself by wearing long sleeves, pants, a wide-brimmed hat, and sunglasses whenever you are outside. Heart disease, diabetes, and high blood pressure  High blood pressure causes heart disease and increases the risk of stroke. High blood pressure is more likely to develop in:  People who have blood pressure in the high end of the normal range (130-139/85-89 mm Hg).  People who are overweight or obese.  People who are African American.  If you are 68-40 years of age, have your blood pressure checked every 3-5 years. If you are 22 years of age or older, have your blood pressure checked every year. You should have your blood pressure measured twice-once when you are at a hospital or clinic, and once when you are not at a hospital or clinic.  Record the average of the two measurements. To check your blood pressure when you are not at a hospital or clinic, you can use:  An automated blood pressure machine at a pharmacy.  A home blood pressure monitor.  If you are between 32 years and 42 years old, ask your health care provider if you should take aspirin to prevent strokes.  Have regular diabetes screenings. This involves taking a blood sample to check your fasting blood sugar level.  If you are at a normal weight and have a low risk for diabetes, have this test once every three years after 56 years of age.  If you are overweight and have a high risk for diabetes, consider being tested at a younger age or more often. Preventing infection Hepatitis B  If you have a higher risk for hepatitis B, you should be screened for this virus. You are considered at high risk for hepatitis B if:  You were born in a country where hepatitis B is common. Ask your health care provider which countries are considered high risk.  Your parents were born in a high-risk country, and you have not been immunized against hepatitis B (hepatitis B vaccine).  You have HIV or AIDS.  You use needles to inject street drugs.  You live with someone who has hepatitis B.  You have had sex with someone who has hepatitis B.  You get hemodialysis treatment.  You take certain medicines for conditions, including cancer, organ transplantation, and autoimmune conditions. Hepatitis C  Blood testing is recommended for:  Everyone born from [birth year] through 1966.  Anyone with known risk factors for hepatitis C. Sexually transmitted infections (STIs)  You should be screened for sexually transmitted infections (STIs) including gonorrhea and chlamydia if:  You are sexually active and are younger than 56 years of age.  You are older than 56 years of age and your health care provider tells you that you are at risk for this type of infection.  Your sexual activity  has changed since you were last screened and you are at an increased risk for chlamydia or gonorrhea. Ask your health care provider if you are at risk.  If you do not have HIV, but are at risk, it may be recommended that you take a prescription medicine daily to prevent HIV infection. This is called pre-exposure prophylaxis (PrEP). You are considered at risk if:  You are sexually active and do not regularly use condoms or know the HIV status of your partner(s).  You take drugs by injection.  You are  sexually active with a partner who has HIV. Talk with your health care provider about whether you are at high risk of being infected with HIV. If you choose to begin PrEP, you should first be tested for HIV. You should then be tested every 3 months for as long as you are taking PrEP. Pregnancy  If you are premenopausal and you may become pregnant, ask your health care provider about preconception counseling.  If you may become pregnant, take 400 to 800 micrograms (mcg) of folic acid every day.  If you want to prevent pregnancy, talk to your health care provider about birth control (contraception). Osteoporosis and menopause  Osteoporosis is a disease in which the bones lose minerals and strength with aging. This can result in serious bone fractures. Your risk for osteoporosis can be identified using a bone density scan.  If you are 48 years of age or older, or if you are at risk for osteoporosis and fractures, ask your health care provider if you should be screened.  Ask your health care provider whether you should take a calcium or vitamin D supplement to lower your risk for osteoporosis.  Menopause may have certain physical symptoms and risks.  Hormone replacement therapy may reduce some of these symptoms and risks. Talk to your health care provider about whether hormone replacement therapy is right for you. Follow these instructions at home:  Schedule regular health, dental, and eye  exams.  Stay current with your immunizations.  Do not use any tobacco products including cigarettes, chewing tobacco, or electronic cigarettes.  If you are pregnant, do not drink alcohol.  If you are breastfeeding, limit how much and how often you drink alcohol.  Limit alcohol intake to no more than 1 drink per day for nonpregnant women. One drink equals 12 ounces of beer, 5 ounces of wine, or 1 ounces of hard liquor.  Do not use street drugs.  Do not share needles.  Ask your health care provider for help if you need support or information about quitting drugs.  Tell your health care provider if you often feel depressed.  Tell your health care provider if you have ever been abused or do not feel safe at home. This information is not intended to replace advice given to you by your health care provider. Make sure you discuss any questions you have with your health care provider. Document Released: 06/19/2011 Document Revised: 05/11/2016 Document Reviewed: 09/07/2015  2017 [employer]

## 2016-12-05 NOTE — Assessment & Plan Note (Signed)
Lipid profile is favorable Has never been on a statin Encouraged regular exercise, healthy diet Check lipid panel

## 2017-01-09 ENCOUNTER — Other Ambulatory Visit: Payer: Self-pay | Admitting: Nurse Practitioner

## 2017-01-09 DIAGNOSIS — R0982 Postnasal drip: Secondary | ICD-10-CM

## 2017-01-09 DIAGNOSIS — R058 Other specified cough: Secondary | ICD-10-CM

## 2017-01-09 DIAGNOSIS — R05 Cough: Secondary | ICD-10-CM

## 2017-03-01 ENCOUNTER — Other Ambulatory Visit (INDEPENDENT_AMBULATORY_CARE_PROVIDER_SITE_OTHER): Payer: 59

## 2017-03-01 DIAGNOSIS — L570 Actinic keratosis: Secondary | ICD-10-CM | POA: Diagnosis not present

## 2017-03-01 DIAGNOSIS — Z1159 Encounter for screening for other viral diseases: Secondary | ICD-10-CM

## 2017-03-01 DIAGNOSIS — L821 Other seborrheic keratosis: Secondary | ICD-10-CM | POA: Diagnosis not present

## 2017-03-01 DIAGNOSIS — M85869 Other specified disorders of bone density and structure, unspecified lower leg: Secondary | ICD-10-CM | POA: Diagnosis not present

## 2017-03-01 DIAGNOSIS — Z114 Encounter for screening for human immunodeficiency virus [HIV]: Secondary | ICD-10-CM

## 2017-03-01 DIAGNOSIS — Z0000 Encounter for general adult medical examination without abnormal findings: Secondary | ICD-10-CM | POA: Diagnosis not present

## 2017-03-01 DIAGNOSIS — D2372 Other benign neoplasm of skin of left lower limb, including hip: Secondary | ICD-10-CM | POA: Diagnosis not present

## 2017-03-01 DIAGNOSIS — Z808 Family history of malignant neoplasm of other organs or systems: Secondary | ICD-10-CM | POA: Diagnosis not present

## 2017-03-01 LAB — CBC WITH DIFFERENTIAL/PLATELET
Basophils Absolute: 0 10*3/uL (ref 0.0–0.1)
Basophils Relative: 0.7 % (ref 0.0–3.0)
EOS ABS: 0.1 10*3/uL (ref 0.0–0.7)
EOS PCT: 2 % (ref 0.0–5.0)
HCT: 40.7 % (ref 36.0–46.0)
Hemoglobin: 13.7 g/dL (ref 12.0–15.0)
LYMPHS PCT: 41.7 % (ref 12.0–46.0)
Lymphs Abs: 1.7 10*3/uL (ref 0.7–4.0)
MCHC: 33.6 g/dL (ref 30.0–36.0)
MCV: 90.8 fl (ref 78.0–100.0)
Monocytes Absolute: 0.4 10*3/uL (ref 0.1–1.0)
Monocytes Relative: 9.2 % (ref 3.0–12.0)
NEUTROS ABS: 1.9 10*3/uL (ref 1.4–7.7)
Neutrophils Relative %: 46.4 % (ref 43.0–77.0)
PLATELETS: 260 10*3/uL (ref 150.0–400.0)
RBC: 4.48 Mil/uL (ref 3.87–5.11)
RDW: 13.2 % (ref 11.5–15.5)
WBC: 4.1 10*3/uL (ref 4.0–10.5)

## 2017-03-01 LAB — COMPREHENSIVE METABOLIC PANEL
ALBUMIN: 4.3 g/dL (ref 3.5–5.2)
ALT: 15 U/L (ref 0–35)
AST: 20 U/L (ref 0–37)
Alkaline Phosphatase: 57 U/L (ref 39–117)
BUN: 9 mg/dL (ref 6–23)
CHLORIDE: 104 meq/L (ref 96–112)
CO2: 28 meq/L (ref 19–32)
Calcium: 9.6 mg/dL (ref 8.4–10.5)
Creatinine, Ser: 0.78 mg/dL (ref 0.40–1.20)
GFR: 81.07 mL/min (ref >60.00)
Glucose, Bld: 105 mg/dL — ABNORMAL HIGH (ref 70–99)
POTASSIUM: 4.4 meq/L (ref 3.5–5.1)
Sodium: 141 mEq/L (ref 135–145)
Total Bilirubin: 0.6 mg/dL (ref 0.2–1.2)
Total Protein: 6.9 g/dL (ref 6.0–8.3)

## 2017-03-01 LAB — LIPID PANEL
CHOL/HDL RATIO: 4
Cholesterol: 274 mg/dL — ABNORMAL HIGH (ref 0–200)
HDL: 77.9 mg/dL (ref >39.00)
LDL Cholesterol: 177 mg/dL — ABNORMAL HIGH (ref 0–99)
NonHDL: 196.12
TRIGLYCERIDES: 96 mg/dL (ref 0.0–149.0)
VLDL: 19.2 mg/dL (ref 0.0–40.0)

## 2017-03-01 LAB — TSH: TSH: 2.72 u[IU]/mL (ref 0.35–4.50)

## 2017-03-01 LAB — VITAMIN D 25 HYDROXY (VIT D DEFICIENCY, FRACTURES): VITD: 43.77 ng/mL (ref 30.00–100.00)

## 2017-03-01 LAB — HEPATITIS C ANTIBODY: HCV Ab: NEGATIVE

## 2017-03-02 LAB — HIV ANTIBODY (ROUTINE TESTING W REFLEX): HIV 1&2 Ab, 4th Generation: NONREACTIVE

## 2017-03-03 ENCOUNTER — Encounter: Payer: Self-pay | Admitting: Internal Medicine

## 2017-05-07 DIAGNOSIS — Z01419 Encounter for gynecological examination (general) (routine) without abnormal findings: Secondary | ICD-10-CM | POA: Diagnosis not present

## 2017-05-23 DIAGNOSIS — R102 Pelvic and perineal pain: Secondary | ICD-10-CM | POA: Diagnosis not present

## 2017-07-11 ENCOUNTER — Other Ambulatory Visit: Payer: Self-pay | Admitting: Nurse Practitioner

## 2017-07-11 DIAGNOSIS — R05 Cough: Secondary | ICD-10-CM

## 2017-07-11 DIAGNOSIS — R0982 Postnasal drip: Secondary | ICD-10-CM

## 2017-07-11 DIAGNOSIS — R058 Other specified cough: Secondary | ICD-10-CM

## 2017-09-25 ENCOUNTER — Other Ambulatory Visit: Payer: Self-pay | Admitting: Internal Medicine

## 2017-09-25 DIAGNOSIS — R0982 Postnasal drip: Secondary | ICD-10-CM

## 2017-09-25 DIAGNOSIS — R058 Other specified cough: Secondary | ICD-10-CM

## 2017-09-25 DIAGNOSIS — R05 Cough: Secondary | ICD-10-CM

## 2017-11-02 IMAGING — DX DG CHEST 2V
2 series · 2 of 2 positions shown · non-contrast
Comparison: 01/29/2014

CLINICAL DATA: Cough over the last 2 months.  Chest pressure.

EXAM:
CHEST  2 VIEW

[chest pa]
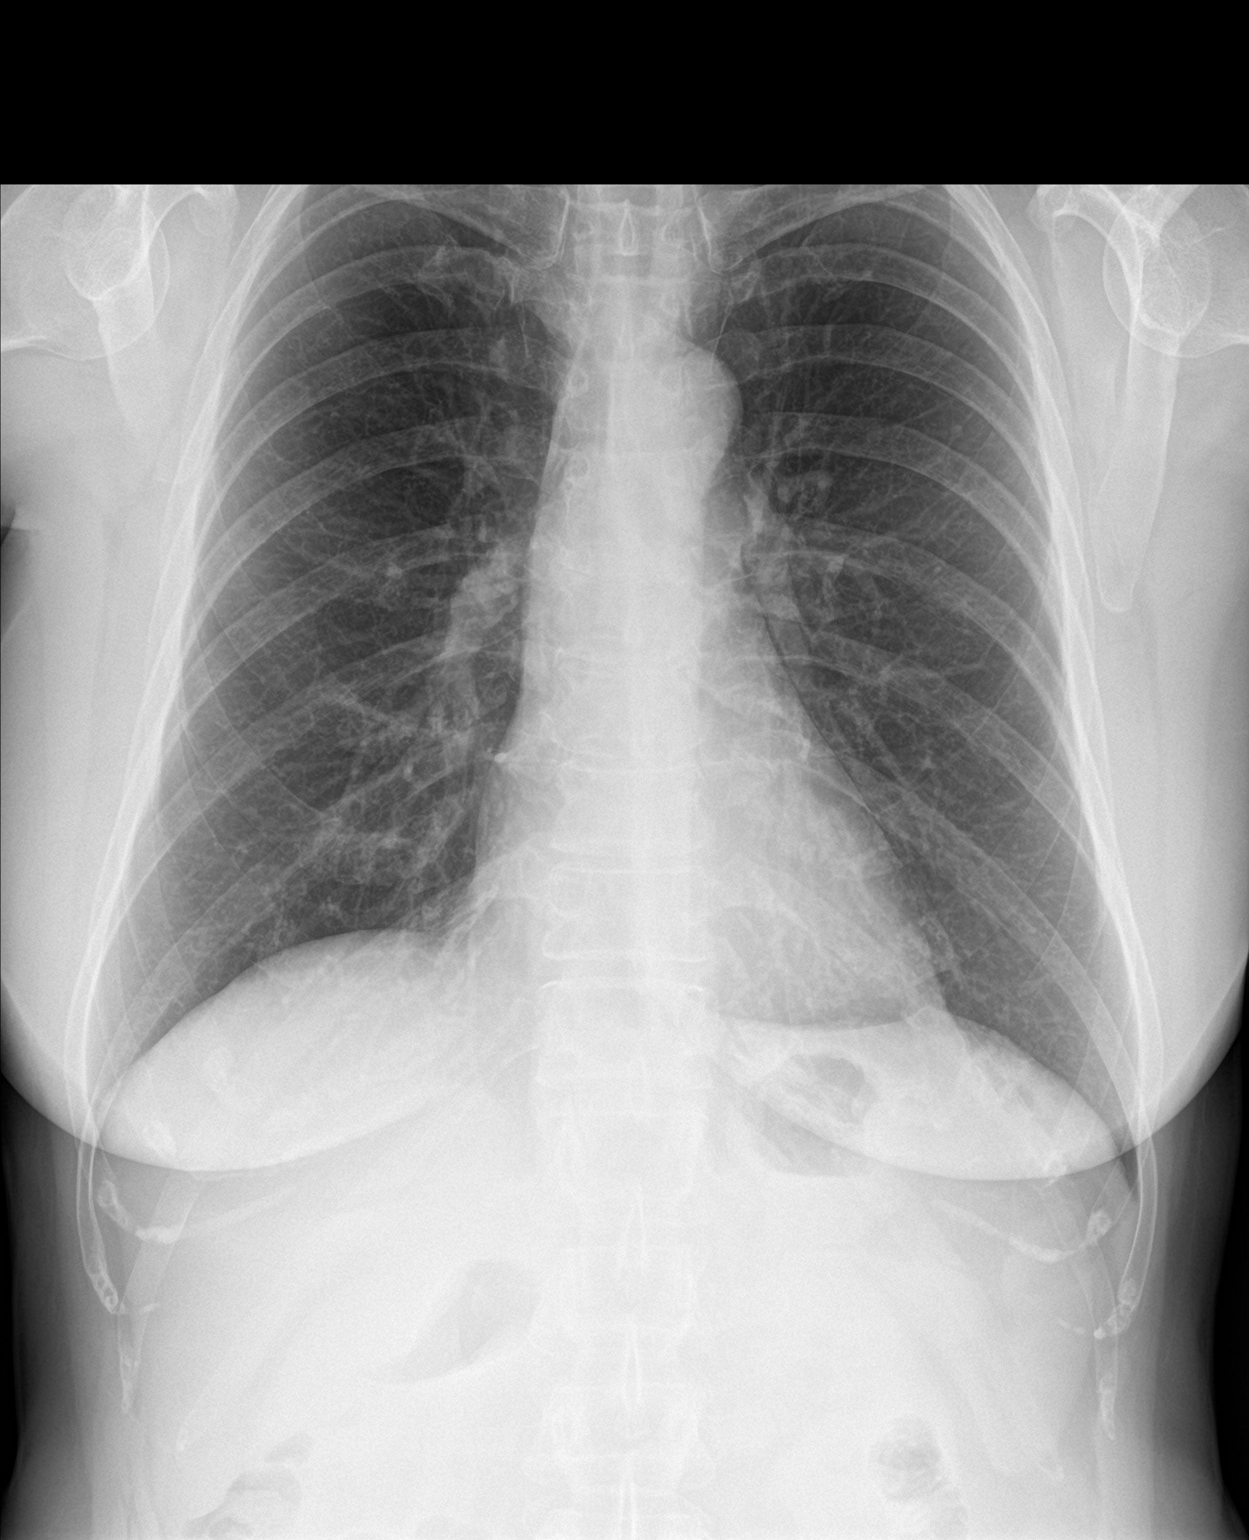

[chest lat]
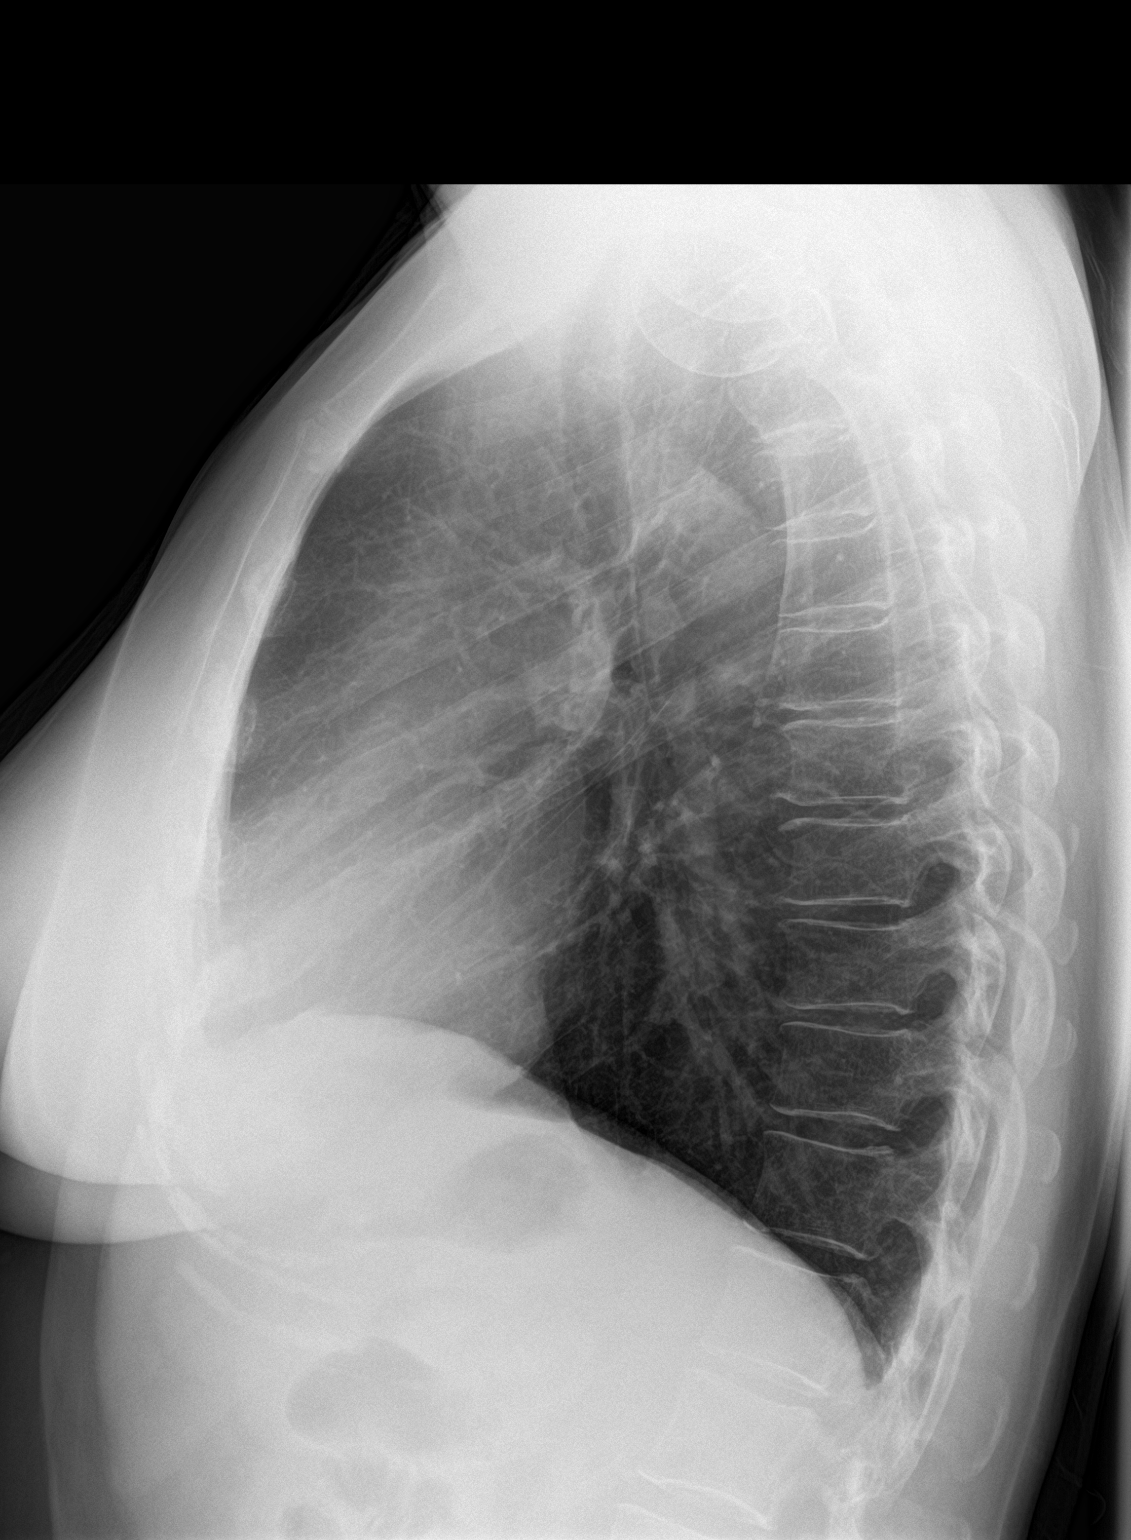

[2 of 2 positions shown; findings below may reference images not displayed]

FINDINGS: Heart size is normal. Mediastinal shadows are normal. The lungs are
clear. No effusions. No bony abnormalities.
IMPRESSION: Normal chest

## 2017-12-09 ENCOUNTER — Other Ambulatory Visit: Payer: Self-pay | Admitting: Internal Medicine

## 2017-12-09 DIAGNOSIS — R0982 Postnasal drip: Secondary | ICD-10-CM

## 2017-12-09 DIAGNOSIS — R05 Cough: Secondary | ICD-10-CM

## 2017-12-09 DIAGNOSIS — R058 Other specified cough: Secondary | ICD-10-CM

## 2018-01-14 ENCOUNTER — Other Ambulatory Visit: Payer: Self-pay | Admitting: Internal Medicine

## 2018-01-14 DIAGNOSIS — R058 Other specified cough: Secondary | ICD-10-CM

## 2018-01-14 DIAGNOSIS — R0982 Postnasal drip: Secondary | ICD-10-CM

## 2018-01-14 DIAGNOSIS — R05 Cough: Secondary | ICD-10-CM

## 2018-03-12 ENCOUNTER — Encounter: Payer: Self-pay | Admitting: Nurse Practitioner

## 2018-03-12 ENCOUNTER — Ambulatory Visit: Payer: 59 | Admitting: Nurse Practitioner

## 2018-03-12 ENCOUNTER — Other Ambulatory Visit: Payer: Self-pay | Admitting: Internal Medicine

## 2018-03-12 VITALS — BP 110/60 | HR 82 | Temp 97.3°F | Ht 65.0 in | Wt 153.4 lb

## 2018-03-12 DIAGNOSIS — J00 Acute nasopharyngitis [common cold]: Secondary | ICD-10-CM | POA: Diagnosis not present

## 2018-03-12 DIAGNOSIS — R058 Other specified cough: Secondary | ICD-10-CM

## 2018-03-12 DIAGNOSIS — R05 Cough: Secondary | ICD-10-CM

## 2018-03-12 DIAGNOSIS — R0982 Postnasal drip: Secondary | ICD-10-CM

## 2018-03-12 LAB — POC INFLUENZA A&B (BINAX/QUICKVUE)
INFLUENZA A, POC: NEGATIVE
INFLUENZA B, POC: NEGATIVE

## 2018-03-12 MED ORDER — HYDROCODONE-HOMATROPINE 5-1.5 MG/5ML PO SYRP: 5.0000 mL | ORAL_SOLUTION | Freq: Three times a day (TID) | ORAL | 0 refills | Status: DC | PRN

## 2018-03-12 MED ORDER — DM-GUAIFENESIN ER 30-600 MG PO TB12: 1.0000 | ORAL_TABLET | Freq: Two times a day (BID) | ORAL | 0 refills | Status: DC | PRN

## 2018-03-12 MED ORDER — CHLORPHEN-PE-ACETAMINOPHEN 4-10-325 MG PO TABS: 1.0000 | ORAL_TABLET | Freq: Two times a day (BID) | ORAL | 0 refills | Status: DC | PRN

## 2018-03-12 MED ORDER — SALINE SPRAY 0.65 % NA SOLN: 1.0000 | NASAL | 0 refills | Status: DC | PRN

## 2018-03-12 NOTE — Progress Notes (Signed)
Subjective:  Patient ID: Alicia Hensley, female    DOB: 05/23/1961  Age: 57 y.o. MRN: [id number]  CC: Sore Throat (started Sunday morning, sore throat and headache, fatigued. )  URI   This is a new problem. The current episode started in the past 7 days. The problem has been unchanged. The maximum temperature recorded prior to her arrival was 100.4 - 100.9 F. Associated symptoms include congestion, coughing, headaches, joint pain, a plugged ear sensation, rhinorrhea, sinus pain, sneezing, a sore throat and swollen glands. Pertinent negatives include no joint swelling, nausea, neck pain, vomiting or wheezing. She has tried acetaminophen and decongestant for the symptoms. The treatment provided no relief.    Outpatient Medications Prior to Visit  Medication Sig Dispense Refill  . Ascorbic Acid (VITAMIN C) 1000 MG tablet Take 1,000 mg by mouth 5 (five) times daily.    Marland Kitchen CALCIUM PO Take 2 tablets by mouth at bedtime.    . cetirizine (ZYRTEC) 10 MG tablet Take 1 tablet (10 mg total) by mouth daily. 14 tablet 0  . Cholecalciferol (VITAMIN D PO) Take by mouth.    . Cyanocobalamin (VITAMIN B 12 PO) Take by mouth.    . Multiple Vitamin (MULTIVITAMIN WITH MINERALS) TABS tablet Take 1 tablet by mouth at bedtime.    . Multiple Vitamins-Minerals (ZINC PO) Take by mouth.    . Probiotic Product (PROBIOTIC PO) Take by mouth.    . sertraline (ZOLOFT) 50 MG tablet     . Tetrahydrozoline HCl (VISINE OP) Place 1 drop into both eyes daily as needed (dry eyes/ redness).    . tretinoin (RETIN-A) 0.025 % cream     . valACYclovir (VALTREX) 500 MG tablet Take 1 tablet by mouth See admin instructions. Take 2 tablets (1000 mg) at onset, then take 1 tablet (500 mg) twice daily for about 3 days    . vitamin E 1000 UNIT capsule Take by mouth.    . fluticasone (FLONASE) 50 MCG/ACT nasal spray Place 2 sprays into both nostrils daily. --- Office visit needed for further refills 16 g 0   No facility-administered medications  prior to visit.     ROS See HPI  Objective:  BP 110/60 (BP Location: Right Arm, Patient Position: Sitting, Cuff Size: Normal)   Pulse 82   Temp (!) 97.3 F (36.3 C) (Oral)   Ht 5\' 5"  (1.651 m)   Wt 153 lb 6.4 oz (69.6 kg)   SpO2 95%   BMI 25.53 kg/m   BP Readings from Last 3 Encounters:  03/12/18 110/60  12/05/16 124/84  09/12/16 118/86    Wt Readings from Last 3 Encounters:  03/12/18 153 lb 6.4 oz (69.6 kg)  12/05/16 156 lb (70.8 kg)  09/12/16 147 lb (66.7 kg)    Physical Exam  Constitutional: She is oriented to person, place, and time. No distress.  HENT:  Right Ear: Tympanic membrane, external ear and ear canal normal.  Left Ear: Tympanic membrane, external ear and ear canal normal.  Nose: Mucosal edema and rhinorrhea present. Right sinus exhibits maxillary sinus tenderness. Right sinus exhibits no frontal sinus tenderness. Left sinus exhibits maxillary sinus tenderness. Left sinus exhibits no frontal sinus tenderness.  Mouth/Throat: Uvula is midline. No trismus in the jaw. Posterior oropharyngeal erythema present. No oropharyngeal exudate.  Eyes: No scleral icterus.  Neck: Normal range of motion. Neck supple.  Cardiovascular: Normal rate and normal heart sounds.  Pulmonary/Chest: Effort normal and breath sounds normal.  Musculoskeletal: She exhibits no edema.  Lymphadenopathy:    She has cervical adenopathy.  Neurological: She is alert and oriented to person, place, and time.  Vitals reviewed.   Lab Results  Component Value Date   WBC 4.1 03/01/2017   HGB 13.7 03/01/2017   HCT 40.7 03/01/2017   PLT 260.0 03/01/2017   GLUCOSE 105 (H) 03/01/2017   CHOL 274 (H) 03/01/2017   TRIG 96.0 03/01/2017   HDL 77.90 03/01/2017   LDLDIRECT 153.7 07/16/2013   LDLCALC 177 (H) 03/01/2017   ALT 15 03/01/2017   AST 20 03/01/2017   NA 141 03/01/2017   K 4.4 03/01/2017   CL 104 03/01/2017   CREATININE 0.78 03/01/2017   BUN 9 03/01/2017   CO2 28 03/01/2017   TSH 2.72  03/01/2017   HGBA1C 5.4 09/15/2009    Dg Chest 2 View  Result Date: 09/19/2016 CLINICAL DATA:  Cough over the last 2 months.  Chest pressure. EXAM: CHEST  2 VIEW COMPARISON:  01/29/2014 FINDINGS: Heart size is normal. Mediastinal shadows are normal. The lungs are clear. No effusions. No bony abnormalities. IMPRESSION: Normal chest Electronically Signed   By: Nelson Chimes M.D.   On: 09/19/2016 14:15    Assessment & Plan:   Alicia Hensley was seen today for sore throat.  Diagnoses and all orders for this visit:  Acute nasopharyngitis -     POC Influenza A&B(BINAX/QUICKVUE) -     HYDROcodone-homatropine (HYCODAN) 5-1.5 MG/5ML syrup; Take 5 mLs by mouth every 8 (eight) hours as needed for cough. -     sodium chloride (OCEAN) 0.65 % SOLN nasal spray; Place 1 spray into both nostrils as needed for congestion. -     dextromethorphan-guaiFENesin (MUCINEX DM) 30-600 MG 12hr tablet; Take 1 tablet by mouth 2 (two) times daily as needed for cough. -     Chlorphen-PE-Acetaminophen 4-10-325 MG TABS; Take 1 tablet by mouth every 12 (twelve) hours as needed.   I am having Alicia Hensley start on HYDROcodone-homatropine, sodium chloride, dextromethorphan-guaiFENesin, and Chlorphen-PE-Acetaminophen. I am also having her maintain her valACYclovir, vitamin C, multivitamin with minerals, CALCIUM PO, Tetrahydrozoline HCl (VISINE OP), Cholecalciferol (VITAMIN D PO), Cyanocobalamin (VITAMIN B 12 PO), Multiple Vitamins-Minerals (ZINC PO), Probiotic Product (PROBIOTIC PO), vitamin E, sertraline, cetirizine, and tretinoin.  Meds ordered this encounter  Medications  . HYDROcodone-homatropine (HYCODAN) 5-1.5 MG/5ML syrup    Sig: Take 5 mLs by mouth every 8 (eight) hours as needed for cough.    Dispense:  60 mL    Refill:  0    Order Specific Question:   Supervising Provider    Answer:   Lucille Passy [[id number]]  . sodium chloride (OCEAN) 0.65 % SOLN nasal spray    Sig: Place 1 spray into both nostrils as needed for  congestion.    Dispense:  15 mL    Refill:  0    Order Specific Question:   Supervising Provider    Answer:   Lucille Passy [[id number]]  . dextromethorphan-guaiFENesin (MUCINEX DM) 30-600 MG 12hr tablet    Sig: Take 1 tablet by mouth 2 (two) times daily as needed for cough.    Dispense:  14 tablet    Refill:  0    Order Specific Question:   Supervising Provider    Answer:   Lucille Passy [[id number]]  . Chlorphen-PE-Acetaminophen 4-10-325 MG TABS    Sig: Take 1 tablet by mouth every 12 (twelve) hours as needed.    Dispense:  6 tablet    Refill:  0  Order Specific Question:   Supervising Provider    Answer:   Lucille Passy [[id number]]    Follow-up: Return if symptoms worsen or fail to improve.  Wilfred Lacy, NP

## 2018-03-12 NOTE — Patient Instructions (Signed)
URI Instructions: Encourage adequate oral hydration.  Use over-the-counter  "cold" medicines  such as "Tylenol cold" , "Advil cold",  "Mucinex" or" Mucinex D"  for cough and congestion.  Avoid decongestants if you have high blood pressure. Use" Delsym" or" Robitussin" cough syrup varietis for cough.  You can use plain "Tylenol" or "Advi"l for fever, chills and achyness.   "Common cold" symptoms are usually triggered by a virus.  The antibiotics are usually not necessary. On average, a" viral cold" illness would take 4-7 days to resolve. Please, make an appointment if you are not better or if you're worse.   call office for oral abx prescription if no improvement in 5days.

## 2018-03-13 ENCOUNTER — Encounter: Payer: Self-pay | Admitting: Nurse Practitioner

## 2018-04-04 DIAGNOSIS — Z86018 Personal history of other benign neoplasm: Secondary | ICD-10-CM | POA: Diagnosis not present

## 2018-04-04 DIAGNOSIS — Z85828 Personal history of other malignant neoplasm of skin: Secondary | ICD-10-CM | POA: Diagnosis not present

## 2018-04-04 DIAGNOSIS — D2372 Other benign neoplasm of skin of left lower limb, including hip: Secondary | ICD-10-CM | POA: Diagnosis not present

## 2018-04-10 ENCOUNTER — Other Ambulatory Visit: Payer: Self-pay | Admitting: Internal Medicine

## 2018-04-10 DIAGNOSIS — R0982 Postnasal drip: Secondary | ICD-10-CM

## 2018-04-10 DIAGNOSIS — R058 Other specified cough: Secondary | ICD-10-CM

## 2018-04-10 DIAGNOSIS — R05 Cough: Secondary | ICD-10-CM

## 2018-05-02 ENCOUNTER — Other Ambulatory Visit: Payer: Self-pay | Admitting: Internal Medicine

## 2018-05-02 DIAGNOSIS — R058 Other specified cough: Secondary | ICD-10-CM

## 2018-05-02 DIAGNOSIS — R05 Cough: Secondary | ICD-10-CM

## 2018-05-02 DIAGNOSIS — R0982 Postnasal drip: Secondary | ICD-10-CM

## 2018-05-08 DIAGNOSIS — N958 Other specified menopausal and perimenopausal disorders: Secondary | ICD-10-CM | POA: Diagnosis not present

## 2018-05-08 DIAGNOSIS — Z801 Family history of malignant neoplasm of trachea, bronchus and lung: Secondary | ICD-10-CM | POA: Diagnosis not present

## 2018-05-08 DIAGNOSIS — Z800 Family history of malignant neoplasm of digestive organs: Secondary | ICD-10-CM | POA: Diagnosis not present

## 2018-05-08 DIAGNOSIS — Z86010 Personal history of colonic polyps: Secondary | ICD-10-CM | POA: Diagnosis not present

## 2018-05-08 DIAGNOSIS — Z01419 Encounter for gynecological examination (general) (routine) without abnormal findings: Secondary | ICD-10-CM | POA: Diagnosis not present

## 2018-05-08 DIAGNOSIS — M8588 Other specified disorders of bone density and structure, other site: Secondary | ICD-10-CM | POA: Diagnosis not present

## 2018-05-16 DIAGNOSIS — E785 Hyperlipidemia, unspecified: Secondary | ICD-10-CM | POA: Diagnosis not present

## 2018-05-17 DIAGNOSIS — L918 Other hypertrophic disorders of the skin: Secondary | ICD-10-CM | POA: Diagnosis not present

## 2018-05-27 DIAGNOSIS — R7989 Other specified abnormal findings of blood chemistry: Secondary | ICD-10-CM | POA: Diagnosis not present

## 2018-05-27 DIAGNOSIS — Z809 Family history of malignant neoplasm, unspecified: Secondary | ICD-10-CM | POA: Diagnosis not present

## 2018-06-24 ENCOUNTER — Other Ambulatory Visit: Payer: Self-pay | Admitting: Internal Medicine

## 2018-06-24 DIAGNOSIS — R058 Other specified cough: Secondary | ICD-10-CM

## 2018-06-24 DIAGNOSIS — R0982 Postnasal drip: Secondary | ICD-10-CM

## 2018-06-24 DIAGNOSIS — R05 Cough: Secondary | ICD-10-CM

## 2018-09-02 ENCOUNTER — Other Ambulatory Visit: Payer: Self-pay | Admitting: Internal Medicine

## 2018-09-02 DIAGNOSIS — R0982 Postnasal drip: Secondary | ICD-10-CM

## 2018-09-02 DIAGNOSIS — R05 Cough: Secondary | ICD-10-CM

## 2018-09-02 DIAGNOSIS — R058 Other specified cough: Secondary | ICD-10-CM

## 2019-02-12 DIAGNOSIS — J0100 Acute maxillary sinusitis, unspecified: Secondary | ICD-10-CM | POA: Diagnosis not present

## 2020-09-09 ENCOUNTER — Encounter: Payer: Self-pay | Admitting: Family Medicine

## 2020-09-09 ENCOUNTER — Telehealth (INDEPENDENT_AMBULATORY_CARE_PROVIDER_SITE_OTHER): Payer: 59 | Admitting: Family Medicine

## 2020-09-09 ENCOUNTER — Other Ambulatory Visit: Payer: Self-pay

## 2020-09-09 VITALS — Temp 99.5°F | Ht 65.0 in | Wt 153.0 lb

## 2020-09-09 DIAGNOSIS — U071 COVID-19: Secondary | ICD-10-CM

## 2020-09-09 MED ORDER — IVERMECTIN 3 MG PO TABS: 300.0000 ug/kg | ORAL_TABLET | Freq: Every day | ORAL | 0 refills | Status: AC

## 2020-09-09 MED ORDER — AZITHROMYCIN 250 MG PO TABS: ORAL_TABLET | ORAL | 0 refills | Status: DC

## 2020-09-09 MED ORDER — BUDESONIDE 1 MG/2ML IN SUSP: 1.0000 mg | Freq: Every day | RESPIRATORY_TRACT | 0 refills | Status: DC

## 2020-09-09 NOTE — Progress Notes (Signed)
Patient: Alicia Hensley MRN: [id number] DOB: 12-01-1961 PCP: Binnie Rail, MD     I connected with Kingsley Plan on 09/09/20 at 11:42am by a video enabled telemedicine application and verified that I am speaking with the correct person using two identifiers.  Location patient: Home Location provider: [facility], Office Persons participating in this virtual visit: Tannisha Kennington and Dr. Rogers Blocker  I discussed the limitations of evaluation and management by telemedicine and the availability of in person appointments. The patient expressed understanding and agreed to proceed.   Subjective:  Chief Complaint  Patient presents with  . Covid Positive    Starting Tuesday    HPI: The patient is a 59 y.o. female who presents today for positive Covid result. She took two home tests that have been positive. Symptoms started on Tuesday night. She said it woke her up in the middle of the night with something heavy on her chest. She also said her legs were aching like the flu. She states she had an excruciating headache and has still been hurting her since that time. She has taken tylenol only with limited relief. She has been coughing for 1.5 days. Her pulse ox is 94-99%.   She is not really short of breath except with anxiety of having covid. likely exposed at her daughters wedding last Saturday night.   She is not vaccinated.    Review of Systems  Constitutional: Positive for fatigue and fever. Negative for chills.  HENT: Positive for congestion and sore throat.   Respiratory: Positive for cough. Negative for shortness of breath and wheezing.   Cardiovascular: Negative for chest pain, palpitations and leg swelling.  Musculoskeletal: Positive for arthralgias and myalgias.  Neurological: Positive for headaches. Negative for dizziness.    Allergies Patient is allergic to biotin.  Past Medical History Patient  has a past medical history of Basal cell cancer, Colon polyps, Diverticulosis, and  Hyperlipidemia.  Surgical History Patient  has a past surgical history that includes throat cyst; G 2 P 2; septal deviation repair; Wisdom tooth extraction; colonoscopy with polypectomy (2012); and Squamous cell carcinoma excision.  Family History Pateint's family history includes ALS in her mother; Colon cancer in her unknown relative; Colon polyps in her mother; Hypertension in her mother; Thyroid nodules in her sister.  Social History Patient  reports that she quit smoking about 18 years ago. She has never used smokeless tobacco. She reports current alcohol use of about 3.0 standard drinks of alcohol per week. She reports that she does not use drugs.    Objective: Vitals:   09/09/20 1123  Temp: 99.5 F (37.5 C)  TempSrc: Temporal  SpO2: 99%  Weight: 153 lb (69.4 kg)  Height: 5\' 5"  (1.651 m)    Body mass index is 25.46 kg/m.  Physical Exam Vitals reviewed.  Constitutional:      General: She is not in acute distress.    Appearance: Normal appearance. She is not ill-appearing or toxic-appearing.  HENT:     Head: Normocephalic and atraumatic.  Pulmonary:     Effort: Pulmonary effort is normal.     Comments: Talking in complete sentences. No difficulty breathing  Skin:    Capillary Refill: Capillary refill takes less than 2 seconds.  Neurological:     General: No focal deficit present.     Mental Status: She is alert and oriented to person, place, and time.  Psychiatric:        Mood and Affect: Mood normal.  Behavior: Behavior normal.        Assessment/plan: 1. COVID-19 -infusion will be set up. Number called with her information.  -starting her on treatment nutraceutical bundle including zinc sulfate, vit D, vit C, quercetin and melatonin 5-15+ days depending on symptoms. She has started most of this on her own.  -pulmicort nebulizer -ivermectin X5 days. Discussed with family that this is still considered experimental; however, multiple RCT have been done that  have shown significant benefit with it's use. They would like to proceed. Weight dosed and discussed must take with food.  -ASA 325mg  (no contraindication) x15-20 days -discussed hold all NSAIDs, would just do tylenol since on ASA.  -azithromycin 500mg  x 5 days.  -gargle mouthwash TID -outside as much as possible.  -quarantine.  -strict precautions given. Pulse ox goal >92-93%.  -if she feels like her breathing is getting harder, more short of breath will add on oral steroids and colchicine. Will follow closely.     Return if symptoms worsen or fail to improve.     Orma Flaming, MD [place]  09/09/2020

## 2020-09-10 ENCOUNTER — Ambulatory Visit (HOSPITAL_COMMUNITY)
Admission: RE | Admit: 2020-09-10 | Discharge: 2020-09-10 | Disposition: A | Discharge status: Home / Self Care | Payer: 59 | Source: Ambulatory Visit | Attending: Pulmonary Disease | Admitting: Pulmonary Disease

## 2020-09-10 ENCOUNTER — Other Ambulatory Visit (HOSPITAL_COMMUNITY): Payer: Self-pay | Admitting: Nurse Practitioner

## 2020-09-10 DIAGNOSIS — U071 COVID-19: Secondary | ICD-10-CM | POA: Diagnosis not present

## 2020-09-10 MED ORDER — ALBUTEROL SULFATE HFA 108 (90 BASE) MCG/ACT IN AERS: 2.0000 | INHALATION_SPRAY | Freq: Once | RESPIRATORY_TRACT | Status: DC | PRN

## 2020-09-10 MED ORDER — ONDANSETRON HCL 4 MG/2ML IJ SOLN: 4.0000 mg | Freq: Once | INTRAMUSCULAR | Status: DC

## 2020-09-10 MED ORDER — SODIUM CHLORIDE 0.9 % IV SOLN: INTRAVENOUS | Status: DC | PRN

## 2020-09-10 MED ORDER — DIPHENHYDRAMINE HCL 50 MG/ML IJ SOLN: 50.0000 mg | Freq: Once | INTRAMUSCULAR | Status: DC | PRN

## 2020-09-10 MED ORDER — METHYLPREDNISOLONE SODIUM SUCC 125 MG IJ SOLR: 125.0000 mg | Freq: Once | INTRAMUSCULAR | Status: DC | PRN

## 2020-09-10 MED ORDER — FAMOTIDINE IN NACL 20-0.9 MG/50ML-% IV SOLN: 20.0000 mg | Freq: Once | INTRAVENOUS | Status: DC | PRN

## 2020-09-10 MED ORDER — EPINEPHRINE 0.3 MG/0.3ML IJ SOAJ: 0.3000 mg | Freq: Once | INTRAMUSCULAR | Status: DC | PRN

## 2020-09-10 MED ORDER — SODIUM CHLORIDE 0.9 % IV SOLN
1200.0000 mg | Freq: Once | INTRAVENOUS | Status: AC
  Administered 2020-09-10: 1200 mg via INTRAVENOUS

## 2020-09-10 NOTE — Progress Notes (Signed)
  Diagnosis: COVID-19  Physician: Dr Joya Gaskins   Procedure: [facility] Infusion Clinic Med: casirivimab\imdevimab infusion - Provided patient with casirivimab\imdevimab fact sheet for patients, parents and caregivers prior to infusion.  Complications: No immediate complications noted.  Discharge: Discharged home   Carin Hock [facility] 09/10/2020

## 2020-09-10 NOTE — Discharge Instructions (Signed)

## 2020-09-10 NOTE — Progress Notes (Signed)
  Diagnosis: COVID-19  Physician: Dr Joya Gaskins  Procedure: Covid Infusion Clinic Med: casirivimab\imdevimab infusion - Provided patient with casirivimab\imdevimab fact sheet for patients, parents and caregivers prior to infusion.  Complications: No immediate complications noted.  Discharge: Discharged home   Alicia Hensley 09/10/2020

## 2020-09-10 NOTE — Progress Notes (Signed)
I connected by phone with Alicia Hensley on 09/10/2020 at 10:18 AM to discuss the potential use of a new treatment for mild to moderate COVID-19 viral infection in non-hospitalized patients.  This patient is a 59 y.o. female that meets the FDA criteria for Emergency Use Authorization of COVID monoclonal antibody casirivimab/imdevimab or bamlanivimab/eteseviamb.  Has a (+) direct SARS-CoV-2 viral test result  Has mild or moderate COVID-19   Is NOT hospitalized due to COVID-19  Is within 10 days of symptom onset  Has at least one of the high risk factor(s) for progression to severe COVID-19 and/or hospitalization as defined in EUA.  Specific high risk criteria : BMI > 25   I have spoken and communicated the following to the patient or parent/caregiver regarding COVID monoclonal antibody treatment:  1. FDA has authorized the emergency use for the treatment of mild to moderate COVID-19 in adults and pediatric patients with positive results of direct SARS-CoV-2 viral testing who are 14 years of age and older weighing at least 40 kg, and who are at high risk for progressing to severe COVID-19 and/or hospitalization.  2. The significant known and potential risks and benefits of COVID monoclonal antibody, and the extent to which such potential risks and benefits are unknown.  3. Information on available alternative treatments and the risks and benefits of those alternatives, including clinical trials.  4. Patients treated with COVID monoclonal antibody should continue to self-isolate and use infection control measures (e.g., wear mask, isolate, social distance, avoid sharing personal items, clean and disinfect "high touch" surfaces, and frequent handwashing) according to CDC guidelines.   5. The patient or parent/caregiver has the option to accept or refuse COVID monoclonal antibody treatment.  After reviewing this information with the patient, the patient has agreed to receive one of the  available covid 19 monoclonal antibodies and will be provided an appropriate fact sheet prior to infusion. Adele Dan, NP 09/10/2020 10:18 AM

## 2020-09-27 ENCOUNTER — Other Ambulatory Visit: Payer: Self-pay | Admitting: Family Medicine

## 2023-01-30 ENCOUNTER — Other Ambulatory Visit: Payer: Self-pay | Admitting: Obstetrics and Gynecology

## 2023-01-30 DIAGNOSIS — Z139 Encounter for screening, unspecified: Secondary | ICD-10-CM

## 2023-03-14 ENCOUNTER — Ambulatory Visit
Admission: RE | Admit: 2023-03-14 | Discharge: 2023-03-14 | Disposition: A | Discharge status: Home / Self Care | Payer: No Typology Code available for payment source | Source: Ambulatory Visit | Attending: Obstetrics and Gynecology | Admitting: Obstetrics and Gynecology

## 2023-03-14 DIAGNOSIS — Z139 Encounter for screening, unspecified: Secondary | ICD-10-CM

## 2023-08-02 ENCOUNTER — Ambulatory Visit: Payer: 59 | Admitting: Cardiology

## 2023-08-14 ENCOUNTER — Encounter: Payer: Self-pay | Admitting: Nurse Practitioner

## 2023-08-14 ENCOUNTER — Ambulatory Visit (INDEPENDENT_AMBULATORY_CARE_PROVIDER_SITE_OTHER): Payer: 59 | Admitting: Nurse Practitioner

## 2023-08-14 VITALS — BP 122/78 | HR 75 | Temp 97.9°F | Ht 65.25 in | Wt 158.4 lb

## 2023-08-14 DIAGNOSIS — Z1329 Encounter for screening for other suspected endocrine disorder: Secondary | ICD-10-CM

## 2023-08-14 DIAGNOSIS — Z136 Encounter for screening for cardiovascular disorders: Secondary | ICD-10-CM | POA: Diagnosis not present

## 2023-08-14 DIAGNOSIS — Z780 Asymptomatic menopausal state: Secondary | ICD-10-CM

## 2023-08-14 DIAGNOSIS — E7800 Pure hypercholesterolemia, unspecified: Secondary | ICD-10-CM

## 2023-08-14 DIAGNOSIS — Z0000 Encounter for general adult medical examination without abnormal findings: Secondary | ICD-10-CM | POA: Diagnosis not present

## 2023-08-14 DIAGNOSIS — M85869 Other specified disorders of bone density and structure, unspecified lower leg: Secondary | ICD-10-CM

## 2023-08-14 DIAGNOSIS — K5730 Diverticulosis of large intestine without perforation or abscess without bleeding: Secondary | ICD-10-CM

## 2023-08-14 DIAGNOSIS — Z79899 Other long term (current) drug therapy: Secondary | ICD-10-CM

## 2023-08-14 DIAGNOSIS — I10 Essential (primary) hypertension: Secondary | ICD-10-CM

## 2023-08-14 DIAGNOSIS — Z0183 Encounter for blood typing: Secondary | ICD-10-CM

## 2023-08-14 DIAGNOSIS — Z1211 Encounter for screening for malignant neoplasm of colon: Secondary | ICD-10-CM

## 2023-08-14 DIAGNOSIS — Z1389 Encounter for screening for other disorder: Secondary | ICD-10-CM

## 2023-08-14 DIAGNOSIS — C44712 Basal cell carcinoma of skin of right lower limb, including hip: Secondary | ICD-10-CM

## 2023-08-14 DIAGNOSIS — E538 Deficiency of other specified B group vitamins: Secondary | ICD-10-CM

## 2023-08-14 DIAGNOSIS — Z7689 Persons encountering health services in other specified circumstances: Secondary | ICD-10-CM

## 2023-08-14 DIAGNOSIS — E559 Vitamin D deficiency, unspecified: Secondary | ICD-10-CM

## 2023-08-14 DIAGNOSIS — F439 Reaction to severe stress, unspecified: Secondary | ICD-10-CM

## 2023-08-14 DIAGNOSIS — Z131 Encounter for screening for diabetes mellitus: Secondary | ICD-10-CM

## 2023-08-14 DIAGNOSIS — Z0001 Encounter for general adult medical examination with abnormal findings: Secondary | ICD-10-CM

## 2023-08-14 NOTE — Progress Notes (Signed)
Complete Physical  Alicia Hensley was seen today for a complete physical  Below references current diagnostics and plan of care:  Assessment and Plan:  Encounter to establish care Medical records requested   Encounter for general adult medical examination with abnormal findings Due annually  Health maintenance reviewed Healthily lifestyle goals set  - CBC with Differential/Platelet - COMPLETE METABOLIC PANEL WITH GFR - Magnesium - Lipid panel - TSH - Hemoglobin A1c - Insulin, random - VITAMIN D 25 Hydroxy (Vit-D Deficiency, Fractures) - EKG 12-Lead - Vitamin B12 - Cortisol - Estrogens, Total - Progesterone - Testosterone, Free, LC/MS/MS  Pure hypercholesterolemia Continue statin as directed. Discussed lifestyle modifications. Recommended diet heavy in fruits and veggies, omega 3's. Decrease consumption of animal meats, cheeses, and dairy products. Remain active and exercise as tolerated. Continue to monitor. Check lipids/TSH  - Lipid panel - TSH - EKG 12-Lead  DIVERTICULOSIS, COLON Controlled Discussed high fiber diet Stay well hydrated  Continue to monitor   Basal cell carcinoma (BCC) of skin of right lower extremity including hip Continue to follow with Dermatology annually Discussed importance  of wearing sun screen  Osteopenia of lower leg, unspecified laterality Pursue a combination of weight-bearing exercises and strength training. Advised on fall prevention measures including proper lighting in all rooms, removal of area rugs and floor clutter, use of walking devices as deemed appropriate, avoidance of uneven walking surfaces. Smoking cessation and moderate alcohol consumption if applicable Consume 800 to 1000 IU of vitamin D daily with a goal vitamin D serum value of 30 ng/mL or higher. Aim for 1000 to 1200 mg of elemental calcium daily through supplements and/or dietary sources.  - Magnesium - VITAMIN D 25 Hydroxy (Vit-D Deficiency,  Fractures)  Screening for cardiovascular condition  - EKG 12-Lead  Screening for thyroid disorder  - TSH  Screening for hematuria or proteinuria  B12 deficiency  - Vitamin B12  Vitamin D deficiency  - VITAMIN D 25 Hydroxy (Vit-D Deficiency, Fractures)  Screening for diabetes mellitus  - Hemoglobin A1c - Insulin, random  Medication management All medications discussed and reviewed in full. All questions and concerns regarding medications addressed.    - CBC with Differential/Platelet - COMPLETE METABOLIC PANEL WITH GFR - Magnesium - Lipid panel - TSH - Hemoglobin A1c - Insulin, random - VITAMIN D 25 Hydroxy (Vit-D Deficiency, Fractures) - EKG 12-Lead - Vitamin B12 - Cortisol - Estrogens, Total - Progesterone - Testosterone, Free, LC/MS/MS  Menopause present  - Estrogens, Total - Progesterone - Testosterone, Free, LC/MS/MS  Stress  - Cortisol  Encounter for blood typing  - ABO AND RH     Notify office for further evaluation and treatment, questions or concerns if any reported s/s fail to improve.   The patient was advised to call back or seek an in-person evaluation if any symptoms worsen or if the condition fails to improve as anticipated.   Further disposition pending results of labs. Discussed med's effects and SE's.    I discussed the assessment and treatment plan with the patient. The patient was provided an opportunity to ask questions and all were answered. The patient agreed with the plan and demonstrated an understanding of the instructions.  Discussed med's effects and SE's. Screening labs and tests as requested with regular follow-up as recommended.  I provided 40 minutes of face-to-face time during this encounter including counseling, chart review, and critical decision making was preformed.  Today's Plan of Care is based on a patient-centered health care approach known as shared  decision making - the decisions, tests and treatments  allow for patient preferences and values to be balanced with clinical evidence.     Future Appointments  Date Time Provider Department Center  10/17/2023 11:00 AM Rollene Rotunda, MD [facility] None  08/13/2024 11:30 AM Ilyaas Musto, Archie Patten, NP [facility] None    HPI  62 y.o. female  presents for a complete physical. She has Hyperlipidemia; DIVERTICULOSIS, COLON; Basal cell carcinoma of skin; COLONIC POLYPS, HX OF; Osteopenia; Vitamin D deficiency; History of cervical dysplasia; and Left arm numbness on their problem list.  She is new to our practice.  Her daughter is a patient here, Azzie Glatter as well as her sister Lanier Prude.  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  Overall she reports feeling well today other than experiencing the occasional stress.  She is concerned about her cortisol and hormone levels.  She is also wanting to know her blood type as she is requesting to donate blood.    BMI is Body mass index is 26.16 kg/m., she has been working on diet and exercise. Wt Readings from Last 3 Encounters:  08/14/23 158 lb 6.4 oz (71.8 kg)  09/09/20 153 lb (69.4 kg)  03/12/18 153 lb 6.4 oz (69.6 kg)   Her blood pressure has been controlled at home, today their BP is BP: 122/78 She does workout. She denies chest pain, shortness of breath, dizziness.   She is on cholesterol medication and denies myalgias. Her cholesterol is not at goal. The cholesterol last visit was:   Lab Results  Component Value Date   CHOL 304 (H) 08/14/2023   HDL 94 08/14/2023   LDLCALC 181 (H) 08/14/2023   LDLDIRECT 153.7 07/16/2013   TRIG 146 08/14/2023   CHOLHDL 3.2 08/14/2023   She has been working on diet and exercise for prediabetes, she is not on bASA, she is not on ACE/ARB and denies polydipsia and polyuria. Last A1C in the office was:  Lab Results  Component Value Date   HGBA1C 5.4 08/14/2023   Last GFR: Lab  Results  Component Value Date   EGFR 101 08/14/2023   Patient is on Vitamin D supplement.   Lab Results  Component Value Date   VD25OH 29 (L) 08/14/2023      Current Medications:  Current Outpatient Medications on File Prior to Visit  Medication Sig Dispense Refill   Ascorbic Acid (VITAMIN C) 1000 MG tablet Take 1,000 mg by mouth 5 (five) times daily.     Brimonidine Tartrate (LUMIFY OP) Apply to eye daily.     cetirizine (ZYRTEC) 10 MG tablet Take 1 tablet (10 mg total) by mouth daily. 14 tablet 0   Cholecalciferol (VITAMIN D PO) Take by mouth.     Cyanocobalamin (VITAMIN B 12 PO) Take by mouth.     Magnesium 400 MG TABS Take by mouth.     Multiple Vitamins-Minerals (ZINC PO) Take by mouth.     Probiotic Product (PROBIOTIC PO) Take by mouth.     sertraline (ZOLOFT) 50 MG tablet      tretinoin (RETIN-A) 0.05 % cream SMARTSIG:Sparingly Topical Every Evening     valACYclovir (VALTREX) 500 MG tablet Take 1 tablet by mouth See admin instructions. Take 2 tablets (1000 mg) at onset, then take 1 tablet (500 mg) twice daily for about 3 days     vitamin E 1000 UNIT capsule Take by mouth.     azithromycin (ZITHROMAX) 250  MG tablet One pill twice a day for 5 days 10 tablet 0   budesonide (PULMICORT) 1 MG/2ML nebulizer solution USE 1 NEBULIZER IN NEBULIZATION DAILY (Patient not taking: Reported on 08/14/2023) 60 mL 0   CALCIUM PO Take 2 tablets by mouth at bedtime. (Patient not taking: Reported on 09/09/2020)     Chlorphen-PE-Acetaminophen 4-10-325 MG TABS Take 1 tablet by mouth every 12 (twelve) hours as needed. (Patient not taking: Reported on 09/09/2020) 6 tablet 0   dextromethorphan-guaiFENesin (MUCINEX DM) 30-600 MG 12hr tablet Take 1 tablet by mouth 2 (two) times daily as needed for cough. (Patient not taking: Reported on 09/09/2020) 14 tablet 0   fluticasone (FLONASE) 50 MCG/ACT nasal spray PLACE 2 SPRAYS IN BOTH NOSTRILS ONCE DAILY. NEED OFFICE VISIT FOR REFILLS (Patient not taking: Reported on  09/09/2020) 16 g 0   HYDROcodone-homatropine (HYCODAN) 5-1.5 MG/5ML syrup Take 5 mLs by mouth every 8 (eight) hours as needed for cough. (Patient not taking: Reported on 09/09/2020) 60 mL 0   Multiple Vitamin (MULTIVITAMIN WITH MINERALS) TABS tablet Take 1 tablet by mouth at bedtime. (Patient not taking: Reported on 08/14/2023)     sodium chloride (OCEAN) 0.65 % SOLN nasal spray Place 1 spray into both nostrils as needed for congestion. (Patient not taking: Reported on 09/09/2020) 15 mL 0   Tetrahydrozoline HCl (VISINE OP) Place 1 drop into both eyes daily as needed (dry eyes/ redness). (Patient not taking: Reported on 09/09/2020)     tretinoin (RETIN-A) 0.025 % cream      No current facility-administered medications on file prior to visit.   Allergies:  Allergies  Allergen Reactions   Biotin Itching and Dermatitis   Medical History:  She has Hyperlipidemia; DIVERTICULOSIS, COLON; Basal cell carcinoma of skin; COLONIC POLYPS, HX OF; Osteopenia; Vitamin D deficiency; History of cervical dysplasia; and Left arm numbness on their problem list.  Health Maintenance:   Immunization History  Administered Date(s) Administered   Influenza Split 12/28/2011, 01/07/2013   Influenza Whole 09/15/2009, 09/19/2010   Influenza,inj,Quad PF,6+ Mos 12/31/2013, 09/09/2014, 11/05/2015   Tdap 07/16/2013   Zoster, Live 01/18/2012   Health Maintenance  Topic Date Due   COVID-19 Vaccine (1) Never done   Zoster Vaccines- Shingrix (1 of 2) 09/23/1979   PAP SMEAR-Modifier  12/19/2015   DEXA SCAN  09/23/2016   MAMMOGRAM  03/01/2017   Colonoscopy  10/28/2019   DTaP/Tdap/Td (2 - Td or Tdap) 07/17/2023   INFLUENZA VACCINE  07/19/2023   Hepatitis C Screening  Completed   HIV Screening  Completed   HPV VACCINES  Aged Out    LMP: No LMP recorded. Patient is postmenopausal. Sexually Active: yes STD testing offered - declines Pap: UTD - Follows GYN Yearly MGM: UTD DEXA: Not due  Colonoscopy: 2010 Dr. Loreta Ave  Cologard ordered  EGD:  Last Dental Exam: UTD Follows Q6 mo Last Eye Exam: UTD Follows yearly  Last Derm Exam: Follows yearly Hx of BCC  Patient Care Team: Adela Glimpse, NP as PCP - General (Nurse Practitioner) Karie Soda, MD as Consulting Physician (General Surgery) Richardean Chimera, MD as Consulting Physician (Obstetrics and Gynecology)  Surgical History:  She has a past surgical history that includes throat cyst; G 2 P 2; septal deviation repair; Wisdom tooth extraction; colonoscopy with polypectomy (2012); and Squamous cell carcinoma excision. Family History:  Herfamily history includes ALS in her mother; Colon cancer in her unknown relative; Colon polyps in her mother; Hypertension in her mother; Thyroid nodules in her sister. Social History:  She reports that she quit smoking about 21 years ago. Her smoking use included cigarettes. She has never used smokeless tobacco. She reports current alcohol use of about 3.0 standard drinks of alcohol per week. She reports that she does not use drugs.  Review of Systems: Review of Systems  Constitutional: Negative.   HENT: Negative.    Eyes: Negative.   Respiratory: Negative.    Cardiovascular: Negative.   Gastrointestinal: Negative.   Genitourinary: Negative.   Musculoskeletal: Negative.   Skin: Negative.   Neurological: Negative.   Endo/Heme/Allergies: Negative.   Psychiatric/Behavioral: Negative.      Physical Exam: Estimated body mass index is 26.16 kg/m as calculated from the following:   Height as of this encounter: 5' 5.25" (1.657 m).   Weight as of this encounter: 158 lb 6.4 oz (71.8 kg). BP 122/78   Pulse 75   Temp 97.9 F (36.6 C)   Ht 5' 5.25" (1.657 m)   Wt 158 lb 6.4 oz (71.8 kg)   SpO2 93%   BMI 26.16 kg/m   General Appearance: Well nourished, well developed, in no apparent distress.  Eyes: PERRLA, EOMs, conjunctiva no swelling or erythema, normal fundi and vessels.  Sinuses: No Frontal/maxillary  tenderness  ENT/Mouth: Ext aud canals clear, normal light reflex with TMs without erythema, bulging. Good dentition. No erythema, swelling, or exudate on post pharynx. Tonsils not swollen or erythematous. Hearing normal.  Neck: Supple, thyroid normal. No bruits  Respiratory: Respiratory effort normal, BS equal bilaterally without rales, rhonchi, wheezing or stridor.  Cardio: RRR without murmurs, rubs or gallops. Brisk peripheral pulses without edema.  Chest: symmetric, with normal excursions and percussion.  Breasts: Symmetric, without lumps, nipple discharge, retractions.  Abdomen: Soft, nontender, no guarding, rebound, hernias, masses, or organomegaly.  Lymphatics: Non tender without lymphadenopathy.  Musculoskeletal: Full ROM all peripheral extremities,5/5 strength, and normal gait.  Skin: Warm, dry without rashes, lesions, ecchymosis. Neuro: Cranial nerves intact, reflexes equal bilaterally. Normal muscle tone, no cerebellar symptoms. Sensation intact.  Psych: Awake and oriented X 3, normal affect, Insight and Judgment appropriate.  Genitourinary: Female genitalia: not done  EKG: WNL no ST changes.  Adela Glimpse, NP 10:17 PM [facility]

## 2023-08-18 LAB — CBC WITH DIFFERENTIAL/PLATELET
Absolute Monocytes: 271 cells/uL (ref 200–950)
Basophils Absolute: 30 {cells}/uL (ref 0–200)
Basophils Relative: 0.9 %
Eosinophils Absolute: 59 {cells}/uL (ref 15–500)
Eosinophils Relative: 1.8 %
HCT: 39.6 % (ref 35.0–45.0)
Hemoglobin: 13.5 g/dL (ref 11.7–15.5)
Lymphs Abs: 1406 {cells}/uL (ref 850–3900)
MCH: 31 pg (ref 27.0–33.0)
MCHC: 34.1 g/dL (ref 32.0–36.0)
MCV: 91 fL (ref 80.0–100.0)
MPV: 10.1 fL (ref 7.5–12.5)
Monocytes Relative: 8.2 %
Neutro Abs: 1535 cells/uL (ref 1500–7800)
Neutrophils Relative %: 46.5 %
Platelets: 248 10*3/uL (ref 140–400)
RBC: 4.35 10*6/uL (ref 3.80–5.10)
RDW: 12.7 % (ref 11.0–15.0)
Total Lymphocyte: 42.6 %
WBC: 3.3 10*3/uL — ABNORMAL LOW (ref 3.8–10.8)

## 2023-08-18 LAB — VITAMIN B12: Vitamin B-12: 445 pg/mL (ref 200–1100)

## 2023-08-18 LAB — ABO AND RH

## 2023-08-18 LAB — ESTROGENS, TOTAL: Estrogen: 47 pg/mL

## 2023-08-18 LAB — LIPID PANEL
Cholesterol: 304 mg/dL — ABNORMAL HIGH (ref <200)
HDL: 94 mg/dL (ref >=50)
LDL Cholesterol (Calc): 181 mg/dL — ABNORMAL HIGH
Non-HDL Cholesterol (Calc): 210 mg/dL — ABNORMAL HIGH (ref <130)
Total CHOL/HDL Ratio: 3.2 (calc) (ref <5.0)
Triglycerides: 146 mg/dL (ref <150)

## 2023-08-18 LAB — COMPLETE METABOLIC PANEL WITH GFR
AG Ratio: 1.7 (calc) (ref 1.0–2.5)
ALT: 14 U/L (ref 6–29)
AST: 17 U/L (ref 10–35)
Albumin: 4.5 g/dL (ref 3.6–5.1)
Alkaline phosphatase (APISO): 64 U/L (ref 37–153)
BUN: 12 mg/dL (ref 7–25)
CO2: 31 mmol/L (ref 20–32)
Calcium: 9.4 mg/dL (ref 8.6–10.4)
Chloride: 102 mmol/L (ref 98–110)
Creat: 0.61 mg/dL (ref 0.50–1.05)
Globulin: 2.6 g/dL (ref 1.9–3.7)
Glucose, Bld: 100 mg/dL — ABNORMAL HIGH (ref 65–99)
Potassium: 4.3 mmol/L (ref 3.5–5.3)
Sodium: 140 mmol/L (ref 135–146)
Total Bilirubin: 0.6 mg/dL (ref 0.2–1.2)
Total Protein: 7.1 g/dL (ref 6.1–8.1)
eGFR: 101 mL/min/{1.73_m2} (ref >=60)

## 2023-08-18 LAB — INSULIN, RANDOM: Insulin: 5 u[IU]/mL

## 2023-08-18 LAB — MAGNESIUM: Magnesium: 2 mg/dL (ref 1.5–2.5)

## 2023-08-18 LAB — HEMOGLOBIN A1C
Hgb A1c MFr Bld: 5.4 %{Hb} (ref <5.7)
Mean Plasma Glucose: 108 mg/dL
eAG (mmol/L): 6 mmol/L

## 2023-08-18 LAB — VITAMIN D 25 HYDROXY (VIT D DEFICIENCY, FRACTURES): Vit D, 25-Hydroxy: 29 ng/mL — ABNORMAL LOW (ref 30–100)

## 2023-08-18 LAB — TSH: TSH: 2.47 m[IU]/L (ref 0.40–4.50)

## 2023-08-18 LAB — TESTOSTERONE, FREE: TESTOSTERONE FREE: 1 pg/mL (ref 0.2–5.0)

## 2023-08-18 LAB — PROGESTERONE: Progesterone: <0.5 ng/mL

## 2023-08-20 NOTE — Patient Instructions (Signed)

## 2023-08-27 ENCOUNTER — Telehealth: Payer: Self-pay

## 2023-08-27 ENCOUNTER — Other Ambulatory Visit: Payer: Self-pay

## 2023-08-27 ENCOUNTER — Other Ambulatory Visit: Payer: Self-pay | Admitting: Nurse Practitioner

## 2023-08-27 DIAGNOSIS — E7800 Pure hypercholesterolemia, unspecified: Secondary | ICD-10-CM

## 2023-08-27 MED ORDER — REPATHA 140 MG/ML ~~LOC~~ SOSY: PREFILLED_SYRINGE | SUBCUTANEOUS | 2 refills | Status: DC

## 2023-08-27 NOTE — Progress Notes (Signed)
Patient sent provider a message stating that she is not taking statin medication (Rosuvastatin) and is afraid to continue taking.  She is interested in [facility] Repatha.  Discussed starting this medication for adequate LDL/cholesterol control.

## 2023-08-27 NOTE — Telephone Encounter (Signed)
PA completed and approved for Repatha

## 2023-10-14 DIAGNOSIS — R002 Palpitations: Secondary | ICD-10-CM | POA: Insufficient documentation

## 2023-10-14 NOTE — Progress Notes (Unsigned)
Cardiology Office Note   Date:  10/18/2023   ID:  BABETTE MCLAMORE, DOB [birth year], MRN [id number]  PCP:  Adela Glimpse, NP  Cardiologist:   None Referring:  Richardean Chimera, MD   Chief Complaint  Patient presents with   Dyslipidemia      History of Present Illness: Alicia Hensley is a 62 y.o. female who presents is referred by Richardean Chimera, MD for evaluation of dyslipidemia.   She has been found to have significantly elevated lipids.  Her total cholesterol is 304 with an LDL of 181.  She does have some family history of vascular disease.  She actually has had a coronary calcium score this year that was 0.  However, she did have some aortic atherosclerosis.  Because of this her referring doctor tried her on statin but she got muscle aches.  She has now been placed on Repatha and is referred to discuss whether this is the correct therapy.  She is active.  She walks 2-3 times a week.  She has some other exercises.  The patient denies any new symptoms such as chest discomfort, neck or arm discomfort. There has been no new shortness of breath, PND or orthopnea. There have been no reported palpitations, presyncope or syncope.   Past Medical History:  Diagnosis Date   Basal cell cancer    back & RUE; Dr Danella Deis   Colon polyps    Diverticulosis    Hyperlipidemia     Past Surgical History:  Procedure Laterality Date   colonoscopy with polypectomy  2012   X 1; diverticulosis; Dr Loreta Ave   G 2 P 2     septal deviation repair     SQUAMOUS CELL CARCINOMA EXCISION     throat cyst     resected   WISDOM TOOTH EXTRACTION       Current Outpatient Medications  Medication Sig Dispense Refill   CALCIUM PO Take 2 tablets by mouth at bedtime.     Magnesium 400 MG TABS Take by mouth.     Probiotic Product (PROBIOTIC PO) Take by mouth.     sertraline (ZOLOFT) 50 MG tablet      tretinoin (RETIN-A) 0.05 % cream SMARTSIG:Sparingly Topical Every Evening     valACYclovir (VALTREX) 500 MG tablet  Take 1 tablet by mouth See admin instructions. Take 2 tablets (1000 mg) at onset, then take 1 tablet (500 mg) twice daily for about 3 days     vitamin E 1000 UNIT capsule Take by mouth.     Ascorbic Acid (VITAMIN C) 1000 MG tablet Take 1,000 mg by mouth 5 (five) times daily. (Patient not taking: Reported on 10/17/2023)     azithromycin (ZITHROMAX) 250 MG tablet One pill twice a day for 5 days (Patient not taking: Reported on 10/17/2023) 10 tablet 0   Brimonidine Tartrate (LUMIFY OP) Apply to eye daily. (Patient not taking: Reported on 10/17/2023)     budesonide (PULMICORT) 1 MG/2ML nebulizer solution USE 1 NEBULIZER IN NEBULIZATION DAILY (Patient not taking: Reported on 08/14/2023) 60 mL 0   cetirizine (ZYRTEC) 10 MG tablet Take 1 tablet (10 mg total) by mouth daily. (Patient not taking: Reported on 10/17/2023) 14 tablet 0   Chlorphen-PE-Acetaminophen 4-10-325 MG TABS Take 1 tablet by mouth every 12 (twelve) hours as needed. (Patient not taking: Reported on 09/09/2020) 6 tablet 0   Cholecalciferol (VITAMIN D PO) Take by mouth. (Patient not taking: Reported on 10/17/2023)     Cyanocobalamin (VITAMIN B 12 PO)  Take by mouth. (Patient not taking: Reported on 10/17/2023)     dextromethorphan-guaiFENesin (MUCINEX DM) 30-600 MG 12hr tablet Take 1 tablet by mouth 2 (two) times daily as needed for cough. (Patient not taking: Reported on 09/09/2020) 14 tablet 0   Evolocumab (REPATHA) 140 MG/ML SOSY Inject 1 mL SubQ every 2 weeks. (Patient not taking: Reported on 10/17/2023) 2.1 mL 2   fluticasone (FLONASE) 50 MCG/ACT nasal spray PLACE 2 SPRAYS IN BOTH NOSTRILS ONCE DAILY. NEED OFFICE VISIT FOR REFILLS (Patient not taking: Reported on 09/09/2020) 16 g 0   HYDROcodone-homatropine (HYCODAN) 5-1.5 MG/5ML syrup Take 5 mLs by mouth every 8 (eight) hours as needed for cough. (Patient not taking: Reported on 09/09/2020) 60 mL 0   Multiple Vitamin (MULTIVITAMIN WITH MINERALS) TABS tablet Take 1 tablet by mouth at bedtime.  (Patient not taking: Reported on 08/14/2023)     Multiple Vitamins-Minerals (ZINC PO) Take by mouth. (Patient not taking: Reported on 10/17/2023)     sodium chloride (OCEAN) 0.65 % SOLN nasal spray Place 1 spray into both nostrils as needed for congestion. (Patient not taking: Reported on 09/09/2020) 15 mL 0   Tetrahydrozoline HCl (VISINE OP) Place 1 drop into both eyes daily as needed (dry eyes/ redness). (Patient not taking: Reported on 09/09/2020)     tretinoin (RETIN-A) 0.025 % cream  (Patient not taking: Reported on 10/17/2023)     No current facility-administered medications for this visit.    Allergies:   Biotin and Statins    Social History:  The patient  reports that she quit smoking about 21 years ago. Her smoking use included cigarettes. She has never used smokeless tobacco. She reports current alcohol use of about 3.0 standard drinks of alcohol per week. She reports that she does not use drugs.   Family History:  The patient's family history includes ALS in her mother; Colon cancer in her unknown relative; Colon polyps in her mother; Hypertension in her mother; Thyroid nodules in her sister.    ROS:  Please see the history of present illness.   Otherwise, review of systems are positive for none.   All other systems are reviewed and negative.    PHYSICAL EXAM: VS:  BP 124/86   Pulse 78   Ht 5' 5.5" (1.664 m)   Wt 159 lb 3.2 oz (72.2 kg)   SpO2 98%   BMI 26.09 kg/m  , BMI Body mass index is 26.09 kg/m. GENERAL:  Well appearing HEENT:  Pupils equal round and reactive, fundi not visualized, oral mucosa unremarkable NECK:  No jugular venous distention, waveform within normal limits, carotid upstroke brisk and symmetric, no bruits, no thyromegaly LYMPHATICS:  No cervical, inguinal adenopathy LUNGS:  Clear to auscultation bilaterally BACK:  No CVA tenderness CHEST:  Unremarkable HEART:  PMI not displaced or sustained,S1 and S2 within normal limits, no S3, no S4, no clicks, no  rubs, no murmurs ABD:  Flat, positive bowel sounds normal in frequency in pitch, no bruits, no rebound, no guarding, no midline pulsatile mass, no hepatomegaly, no splenomegaly EXT:  2 plus pulses throughout, no edema, no cyanosis no clubbing SKIN:  No rashes no nodules NEURO:  Cranial nerves II through XII grossly intact, motor grossly intact throughout PSYCH:  Cognitively intact, oriented to person place and time    EKG:  EKG Interpretation Date/Time:  Wednesday October 17 2023 11:13:52 EDT Ventricular Rate:  68 PR Interval:  144 QRS Duration:  84 QT Interval:  374 QTC Calculation: 397 R Axis:   -  17  Text Interpretation: Normal sinus rhythm When compared with ECG of 29-Jan-2014 20:15, No significant change was found Inferior leads do not meet criteria for q waves Confirmed by Rollene Rotunda ([id number]) on 10/18/2023 8:44:13 AM     Recent Labs: 08/14/2023: ALT 14; BUN 12; Creat 0.61; Hemoglobin 13.5; Magnesium 2.0; Platelets 248; Potassium 4.3; Sodium 140; TSH 2.47    Lipid Panel    Component Value Date/Time   CHOL 304 (H) 08/14/2023 1220   CHOL 253 (H) 09/23/2014 1232   TRIG 146 08/14/2023 1220   TRIG 54 09/23/2014 1232   HDL 94 08/14/2023 1220   HDL 95 09/23/2014 1232   CHOLHDL 3.2 08/14/2023 1220   VLDL 19.2 03/01/2017 1218   LDLCALC 181 (H) 08/14/2023 1220   LDLCALC 147 (H) 09/23/2014 1232   LDLDIRECT 153.7 07/16/2013 1445      Wt Readings from Last 3 Encounters:  10/17/23 159 lb 3.2 oz (72.2 kg)  08/14/23 158 lb 6.4 oz (71.8 kg)  09/09/20 153 lb (69.4 kg)      Other studies Reviewed: Additional studies/ records that were reviewed today include: Labs.  CT Review of the above records demonstrates:  Please see elsewhere in the note.     ASSESSMENT AND PLAN:  Aortic atherosclerosis: The patient has no symptoms but given this she needs aggressive risk reduction.  This is as discussed below.  Dyslipidemia: Given her LDL mentioned above and the fact that she is  already pursuing a good diet I agree with aggressive medical management.  This is reinforced by evidence of the aortic atherosclerosis.  She is intolerant of statin.  Therefore, I agree with Repatha.  Goal would be an LDL in my mind in the 70s.  We discussed a Mediterranean plant-based diet.  I will defer follow-up to her primary provider and referring provider.   Current medicines are reviewed at length with the patient today.  The patient does not have concerns regarding medicines.  The following changes have been made:  no change  Labs/ tests ordered today include: None    Disposition:   FU with me as needed    Signed, Rollene Rotunda, MD  10/18/2023 8:45 AM    [facility]

## 2023-10-17 ENCOUNTER — Encounter: Payer: Self-pay | Admitting: Cardiology

## 2023-10-17 ENCOUNTER — Ambulatory Visit: Payer: 59 | Attending: Cardiology | Admitting: Cardiology

## 2023-10-17 VITALS — BP 124/86 | HR 78 | Ht 65.5 in | Wt 159.2 lb

## 2023-10-17 DIAGNOSIS — R002 Palpitations: Secondary | ICD-10-CM | POA: Diagnosis not present

## 2023-10-17 NOTE — Patient Instructions (Signed)
Medication Instructions:  Continue all medications   Lab Work: None ordered   Testing/Procedures: None ordered   Follow-Up: At [facility], you and your health needs are our priority.  As part of our continuing mission to provide you with exceptional heart care, we have created designated Provider Care Teams.  These Care Teams include your primary Cardiologist (physician) and Advanced Practice Providers (APPs -  Physician Assistants and Nurse Practitioners) who all work together to provide you with the care you need, when you need it.  We recommend signing up for the patient portal called "MyChart".  Sign up information is provided on this After Visit Summary.  MyChart is used to connect with patients for Virtual Visits (Telemedicine).  Patients are able to view lab/test results, encounter notes, upcoming appointments, etc.  Non-urgent messages can be sent to your provider as well.   To learn more about what you can do with MyChart, go to [email or web address].    Your next appointment:  As Needed    Provider:  Dr.Hochrein

## 2023-10-18 ENCOUNTER — Encounter: Payer: Self-pay | Admitting: Cardiology

## 2023-11-19 ENCOUNTER — Encounter: Payer: Self-pay | Admitting: Nurse Practitioner

## 2023-12-24 ENCOUNTER — Ambulatory Visit: Payer: 59 | Admitting: Orthopaedic Surgery

## 2024-08-13 ENCOUNTER — Encounter: Payer: 59 | Admitting: Nurse Practitioner

## 2024-10-09 ENCOUNTER — Ambulatory Visit (INDEPENDENT_AMBULATORY_CARE_PROVIDER_SITE_OTHER): Admitting: Internal Medicine

## 2024-10-09 ENCOUNTER — Telehealth (HOSPITAL_BASED_OUTPATIENT_CLINIC_OR_DEPARTMENT_OTHER): Payer: Self-pay | Admitting: *Deleted

## 2024-10-09 ENCOUNTER — Telehealth: Payer: Self-pay | Admitting: Pharmacy Technician

## 2024-10-09 ENCOUNTER — Other Ambulatory Visit (HOSPITAL_COMMUNITY): Payer: Self-pay

## 2024-10-09 VITALS — BP 124/74 | HR 84 | Ht 65.5 in | Wt 152.0 lb

## 2024-10-09 DIAGNOSIS — T466X5D Adverse effect of antihyperlipidemic and antiarteriosclerotic drugs, subsequent encounter: Secondary | ICD-10-CM

## 2024-10-09 DIAGNOSIS — T466X5A Adverse effect of antihyperlipidemic and antiarteriosclerotic drugs, initial encounter: Secondary | ICD-10-CM

## 2024-10-09 DIAGNOSIS — M791 Myalgia, unspecified site: Secondary | ICD-10-CM | POA: Diagnosis not present

## 2024-10-09 DIAGNOSIS — I700 Atherosclerosis of aorta: Secondary | ICD-10-CM | POA: Diagnosis not present

## 2024-10-09 DIAGNOSIS — E785 Hyperlipidemia, unspecified: Secondary | ICD-10-CM

## 2024-10-09 DIAGNOSIS — Z79899 Other long term (current) drug therapy: Secondary | ICD-10-CM

## 2024-10-09 MED ORDER — REPATHA SURECLICK 140 MG/ML ~~LOC~~ SOAJ: 140.0000 mg | SUBCUTANEOUS | 6 refills | Status: AC

## 2024-10-09 NOTE — Telephone Encounter (Signed)
-----   Message from Camelia JONETTA Slade sent at 10/09/2024 11:07 AM EDT ----- Regarding: RE: REPATHA  REFILL No PA needed at this time. Under demographics show patients preferred pharmacy ----- Message ----- From: Gladis Porter HERO, LPN Sent: [phone]  10:52 AM EDT To: Andriette HERO Eke, RN; Rx Prior Auth Team Subject: REPATHA  REFILL                                 We refilled this pts Repatha  to [facility], but Dr. Mona wanted to make sure it didn't need another PA done, since its been a bit from the last PA approval for Repatha   Can you look into this for us  and script sent to [facility]?  Thanks, [employer]

## 2024-10-09 NOTE — Patient Instructions (Signed)
 Medication Instructions:   Dr. Mona recommends REPATHA  (PCSK9). This is an injectable cholesterol medication self-administered once every 14 days. This medication will likely need prior approval with your insurance company, which we will work on. If the medication is not approved initially, we may need to do an appeal with your insurance. If approved, we will provide you with copay and cost information. We'll then send the prescription to your pharmacy. We would have you complete another set of fasting labs between 3-4 months to reassess cholesterol.   REPATHA   is self-injected once every 14 days in subcutaneous or fatty tissue - such as belly or side/outer/upper thigh. It is best stored in the refrigerator but is stable at room temp up to 28 days. Please take the pen-injector out of fridge about 30 minutes - 1 hour prior to injection, to allow it to warm closer to room temperature.   This medication is very effective in lowering LDL and can lower LPa, as Dr. Mona mentioned. It is also generally well tolerated -- most common reaction may be cold-like symptoms such as runny nose, scratchy throat, as this is an antibody therapy. It is generally self-limiting and after a few doses, your body should have normalized to the medication.   Here is a demo video: https://www.repatha .com/how-to-start-repatha -injection   If you need a co-pay card for Repatha : https://www.repatha .com/repatha -cost If you need a co-pay card for Praluent: [email or web address]  Patient Assistance:    These foundations have funds at various times.   The [employer]: https://www.panfoundation.org/disease-funds/hypercholesterolemia/ -- can sign up for wait list  The [facility] offers assistance to help pay for medication copays.  They will cover copays for all cholesterol lowering meds, including statins, fibrates, omega-3 fish oils like Vascepa, ezetimibe, Repatha , Praluent, Nexletol,  Nexlizet.  The cards are usually good for $2,500 or 12 months, whichever comes first. Our fax # is [phone] (you will need this to apply) Go to healthwellfoundation.org Click on "Apply Now" Answer questions as to whom is applying (patient or representative) Your disease fund will be "hypercholesterolemia - Medicare access" They will ask questions about finances and which medications you are taking for cholesterol When you submit, the approval is usually within minutes.  You will need to print the card information from the site You will need to show this information to your pharmacy, they will bill your Medicare Part D plan first -then bill [employer] --for the copay.   You can also call them at [phone], although the hold times can be quite long.     *If you need a refill on your cardiac medications before your next appointment, please call your pharmacy*   Lab Work:  RETURN FOR LABS IN 3 MONTHS (AROUND 01/09/25) --LAB ON 3RD FLOOR [address]--NMR LIPOPROFILE AND LIPOPROTEIN A --PLEASE COME FASTING TO THIS LAB APPOINTMENT  If you have labs (blood work) drawn today and your tests are completely normal, you will receive your results only by: MyChart Message (if you have MyChart) OR A paper copy in the mail If you have any lab test that is abnormal or we need to change your treatment, we will call you to review the results.  Follow-Up:  AS NEEDED WITH DR HILTY

## 2024-10-09 NOTE — Progress Notes (Signed)
 LIPID CLINIC CONSULT NOTE  Chief Complaint:  Manage dyslipidemia  Primary Care Physician: Laurice President, NP  Primary Cardiologist:  None  HPI:  Alicia Hensley is a 63 y.o. female who is being seen today for the evaluation of dyslipidemia at the request of Leva Rush, MD. this is a pleasant 63 year old female kindly referred for evaluation management of dyslipidemia.  She has a history of high cholesterol and family history of high cholesterol.  In the past she had a lipid profile and was told that she has a very high HDL cholesterol which was thought to be cardioprotective.  Indeed she had no coronary calcium by calcium score in 11/12/2023 however she did have some aortic atherosclerosis suggesting ASCVD and therefore her target LDL should be less than 70.  LDL has bounced around quite a bit but it been as high as 181 recently as well as in the 150s.  She was started on rosuvastatin which she took for about a week but then had significant myalgias.  Subsequently this was discontinued and her primary care provider was able to get authorization for Repatha .  She is not currently on the Repatha  and did not have any repeat lipids.  PMHx:  Past Medical History:  Diagnosis Date   Basal cell cancer    back & RUE; Dr Helga   Colon polyps    Diverticulosis    Hyperlipidemia     Past Surgical History:  Procedure Laterality Date   colonoscopy with polypectomy  2011-11-12   X 1; diverticulosis; Dr Kristie   G 2 P 2     septal deviation repair     SQUAMOUS CELL CARCINOMA EXCISION     throat cyst     resected   WISDOM TOOTH EXTRACTION      FAMHx:  Family History  Problem Relation Age of Onset   Hypertension Mother    Colon polyps Mother        pre cancerous polyps   ALS Mother        died 11/12/2015   Thyroid  nodules Sister        benign   Colon cancer Unknown        3 maternal great aunts   Diabetes Neg Hx    Stroke Neg Hx    Heart attack Neg Hx     SOCHx:   reports that she quit  smoking about 22 years ago. Her smoking use included cigarettes. She has never used smokeless tobacco. She reports current alcohol use of about 3.0 standard drinks of alcohol per week. She reports that she does not use drugs.  ALLERGIES:  Allergies  Allergen Reactions   Biotin Itching and Dermatitis   Statins     ROS: Pertinent items noted in HPI and remainder of comprehensive ROS otherwise negative.  HOME MEDS: Current Outpatient Medications on File Prior to Visit  Medication Sig Dispense Refill   CALCIUM PO Take 2 tablets by mouth at bedtime.     Magnesium 400 MG TABS Take by mouth.     Probiotic Product (PROBIOTIC PO) Take by mouth.     sertraline (ZOLOFT) 50 MG tablet      tretinoin (RETIN-A) 0.05 % cream SMARTSIG:Sparingly Topical Every Evening     valACYclovir (VALTREX) 500 MG tablet Take 1 tablet by mouth See admin instructions. Take 2 tablets (1000 mg) at onset, then take 1 tablet (500 mg) twice daily for about 3 days     vitamin E 1000 UNIT capsule Take  by mouth.     No current facility-administered medications on file prior to visit.    LABS/IMAGING: No results found for this or any previous visit (from the past 48 hours). No results found.  LIPID PANEL:    Component Value Date/Time   CHOL 304 (H) 08/14/2023 1220   CHOL 253 (H) 09/23/2014 1232   TRIG 146 08/14/2023 1220   TRIG 54 09/23/2014 1232   HDL 94 08/14/2023 1220   HDL 95 09/23/2014 1232   CHOLHDL 3.2 08/14/2023 1220   VLDL 19.2 03/01/2017 1218   LDLCALC 181 (H) 08/14/2023 1220   LDLCALC 147 (H) 09/23/2014 1232   LDLDIRECT 153.7 07/16/2013 1445    No results found for: LIPOA   WEIGHTS: Wt Readings from Last 3 Encounters:  10/09/24 152 lb (68.9 kg)  10/17/23 159 lb 3.2 oz (72.2 kg)  08/14/23 158 lb 6.4 oz (71.8 kg)    VITALS: BP 124/74 (BP Location: Left Arm, Patient Position: Sitting, Cuff Size: Normal)   Pulse 84   Ht 5' 5.5 (1.664 m)   Wt 152 lb (68.9 kg)   BMI 24.91 kg/m    EXAM: Deferred  EKG: Deferred  ASSESSMENT: Mixed dyslipidemia, goal LDL less than 70 Aortic atherosclerosis 0 CAC score (2024) Statin myalgia  PLAN: 1.   [name] has a mixed dyslipidemia with a target LDL less than 70 due to aortic atherosclerosis seen on imaging although she did not yet develop any coronary calcium.  She unfortunately had myalgias to statins and was approved for Repatha  however is not currently taking it.  We will try to get that medicine reapproved as although she does have some of the medication it is likely expired.  Plan then to repeat lipids including NMR and LP(a) in about 3 months on therapy.  Thanks again for the kind referral.  Vinie KYM Maxcy, MD, [facility], FNLA, FACP  [place]  [facility]  Medical Director of the [facility] Lipid [facility] &  Cardiovascular Risk Reduction Clinic Diplomate of the American Board of Clinical Lipidology Attending Cardiologist  Direct Dial: [phone]  Fax: [phone]  Website:  [email or web address]  Vinie BROCKS Leyli Kevorkian 10/09/2024, 10:22 AM

## 2024-10-09 NOTE — Telephone Encounter (Signed)
 Pharmacy Patient Advocate Encounter   Received notification from Physician's Office that prior authorization for Repatha  is required/requested.   Insurance verification completed.   The patient is insured through [facility].   Per test claim: The current 10/09/24 day co-pay is, $45.00- one month.  No PA needed at this time. This test claim was processed through [facility]- copay amounts may vary at other pharmacies due to pharmacy/plan contracts, or as the patient moves through the different stages of their insurance plan.

## 2024-10-20 ENCOUNTER — Encounter: Payer: Self-pay | Admitting: Radiology
